# Patient Record
Sex: Female | Born: 1940 | Race: White | Hispanic: No | Marital: Married | Smoking: Never smoker
Health system: Southern US, Community
[De-identification: ages and names within clinical notes are randomized; demographics above are authoritative.]

## PROBLEM LIST (undated history)

## (undated) DIAGNOSIS — R0602 Shortness of breath: Secondary | ICD-10-CM

## (undated) DIAGNOSIS — M1990 Unspecified osteoarthritis, unspecified site: Secondary | ICD-10-CM

## (undated) DIAGNOSIS — I10 Essential (primary) hypertension: Secondary | ICD-10-CM

## (undated) DIAGNOSIS — J189 Pneumonia, unspecified organism: Secondary | ICD-10-CM

## (undated) DIAGNOSIS — D649 Anemia, unspecified: Secondary | ICD-10-CM

## (undated) HISTORY — PX: OTHER SURGICAL HISTORY: SHX169

## (undated) HISTORY — PX: BREAST CYST EXCISION: SHX579

## (undated) HISTORY — PX: HEMORRHOID SURGERY: SHX153

## (undated) HISTORY — PX: TONSILLECTOMY: SUR1361

## (undated) HISTORY — PX: ABDOMINAL HYSTERECTOMY: SHX81

## (undated) HISTORY — PX: APPENDECTOMY: SHX54

## (undated) HISTORY — PX: BREAST SURGERY: SHX581

## (undated) HISTORY — PX: DILATION AND CURETTAGE OF UTERUS: SHX78

---

## 1999-03-23 ENCOUNTER — Encounter: Admission: RE | Admit: 1999-03-23 | Discharge: 1999-03-23 | Payer: Self-pay | Admitting: Obstetrics and Gynecology

## 1999-03-23 ENCOUNTER — Encounter: Payer: Self-pay | Admitting: Obstetrics and Gynecology

## 1999-04-04 ENCOUNTER — Encounter: Admission: RE | Admit: 1999-04-04 | Discharge: 1999-04-04 | Payer: Self-pay | Admitting: Obstetrics and Gynecology

## 1999-04-04 ENCOUNTER — Encounter: Payer: Self-pay | Admitting: Obstetrics and Gynecology

## 1999-06-07 ENCOUNTER — Encounter: Payer: Self-pay | Admitting: General Surgery

## 1999-06-09 ENCOUNTER — Encounter (INDEPENDENT_AMBULATORY_CARE_PROVIDER_SITE_OTHER): Payer: Self-pay

## 1999-06-09 ENCOUNTER — Ambulatory Visit (HOSPITAL_COMMUNITY): Admission: RE | Admit: 1999-06-09 | Discharge: 1999-06-09 | Payer: Self-pay | Admitting: General Surgery

## 2000-03-27 ENCOUNTER — Encounter: Admission: RE | Admit: 2000-03-27 | Discharge: 2000-03-27 | Payer: Self-pay | Admitting: Obstetrics and Gynecology

## 2000-03-27 ENCOUNTER — Encounter: Payer: Self-pay | Admitting: Obstetrics and Gynecology

## 2001-03-28 ENCOUNTER — Encounter: Admission: RE | Admit: 2001-03-28 | Discharge: 2001-03-28 | Payer: Self-pay | Admitting: Obstetrics and Gynecology

## 2001-03-28 ENCOUNTER — Encounter: Payer: Self-pay | Admitting: Obstetrics and Gynecology

## 2002-04-01 ENCOUNTER — Encounter: Admission: RE | Admit: 2002-04-01 | Discharge: 2002-04-01 | Payer: Self-pay | Admitting: Obstetrics and Gynecology

## 2002-04-01 ENCOUNTER — Encounter: Payer: Self-pay | Admitting: Obstetrics and Gynecology

## 2002-04-09 ENCOUNTER — Encounter (INDEPENDENT_AMBULATORY_CARE_PROVIDER_SITE_OTHER): Payer: Self-pay | Admitting: *Deleted

## 2002-04-09 ENCOUNTER — Ambulatory Visit (HOSPITAL_COMMUNITY): Admission: RE | Admit: 2002-04-09 | Discharge: 2002-04-09 | Payer: Self-pay | Admitting: Gastroenterology

## 2003-04-15 ENCOUNTER — Encounter: Admission: RE | Admit: 2003-04-15 | Discharge: 2003-04-15 | Payer: Self-pay | Admitting: Obstetrics and Gynecology

## 2004-03-29 ENCOUNTER — Encounter: Admission: RE | Admit: 2004-03-29 | Discharge: 2004-03-29 | Payer: Self-pay | Admitting: Obstetrics and Gynecology

## 2004-04-05 ENCOUNTER — Encounter: Admission: RE | Admit: 2004-04-05 | Discharge: 2004-04-05 | Payer: Self-pay | Admitting: Obstetrics and Gynecology

## 2004-05-03 ENCOUNTER — Encounter: Admission: RE | Admit: 2004-05-03 | Discharge: 2004-05-03 | Payer: Self-pay | Admitting: Orthopedic Surgery

## 2005-04-06 ENCOUNTER — Ambulatory Visit (HOSPITAL_COMMUNITY): Admission: RE | Admit: 2005-04-06 | Discharge: 2005-04-06 | Payer: Self-pay | Admitting: Obstetrics and Gynecology

## 2006-04-09 ENCOUNTER — Encounter: Admission: RE | Admit: 2006-04-09 | Discharge: 2006-04-09 | Payer: Self-pay | Admitting: Obstetrics and Gynecology

## 2007-04-11 ENCOUNTER — Encounter: Admission: RE | Admit: 2007-04-11 | Discharge: 2007-04-11 | Payer: Self-pay | Admitting: Obstetrics and Gynecology

## 2008-04-13 ENCOUNTER — Encounter: Admission: RE | Admit: 2008-04-13 | Discharge: 2008-04-13 | Payer: Self-pay | Admitting: Obstetrics and Gynecology

## 2009-04-14 ENCOUNTER — Encounter: Admission: RE | Admit: 2009-04-14 | Discharge: 2009-04-14 | Payer: Self-pay | Admitting: Internal Medicine

## 2009-06-17 ENCOUNTER — Encounter: Admission: RE | Admit: 2009-06-17 | Discharge: 2009-06-17 | Payer: Self-pay | Admitting: Specialist

## 2009-06-21 ENCOUNTER — Ambulatory Visit (HOSPITAL_BASED_OUTPATIENT_CLINIC_OR_DEPARTMENT_OTHER): Admission: RE | Admit: 2009-06-21 | Discharge: 2009-06-21 | Payer: Self-pay | Admitting: Specialist

## 2010-04-15 ENCOUNTER — Encounter: Admission: RE | Admit: 2010-04-15 | Discharge: 2010-04-15 | Payer: Self-pay | Admitting: Obstetrics and Gynecology

## 2010-06-18 ENCOUNTER — Encounter: Payer: Self-pay | Admitting: Obstetrics and Gynecology

## 2010-08-15 LAB — BASIC METABOLIC PANEL
BUN: 12 mg/dL (ref 6–23)
CO2: 29 mEq/L (ref 19–32)
Calcium: 9.6 mg/dL (ref 8.4–10.5)
Chloride: 103 mEq/L (ref 96–112)
Creatinine, Ser: 0.65 mg/dL (ref 0.4–1.2)
GFR calc Af Amer: >60 mL/min (ref >60)
GFR calc non Af Amer: >60 mL/min (ref >60)
Glucose, Bld: 105 mg/dL — ABNORMAL HIGH (ref 70–99)
Potassium: 3.7 mEq/L (ref 3.5–5.1)
Sodium: 139 mEq/L (ref 135–145)

## 2010-08-15 LAB — POCT HEMOGLOBIN-HEMACUE: Hemoglobin: 14.4 g/dL (ref 12.0–15.0)

## 2010-10-14 NOTE — Op Note (Signed)
[place]  Patient:    Sara Boyer, Sara Boyer                      MRN: [id number] Proc. Date: 06/09/99 Attending:  Sheppard Plumber. Earlene Plater, M.D.                           Operative Report  PREOPERATIVE DIAGNOSIS:  Internal and external hemorrhoids.  POSTOPERATIVE DIAGNOSIS:  Internal and external hemorrhoids.  PROCEDURE:  Hemorrhoidectomy, complex and proctosigmoidoscopy.  SURGEON:  Timothy E. Earlene Plater, M.D.  ANESTHESIA:  General.  INDICATIONS:  [name] is an otherwise healthy 70 year old Caucasian female ho is treated for stable hypertension.  She has had hemorrhoids for 20+ years, but in the past two years, they have gotten worse.  She now has constant protrusion, constant ______, and daily bleeding.  The pain has increased over the past few weeks.  The patient wears pads, maintains a diet, does not strain at stool, and  still has this problem.  She has failed all conservative management.  Surgical hemorrhoidectomy has been discussed with the patient, and she is prepared for that surgery.  DESCRIPTION OF PROCEDURE:  The patient was brought to the operating room, placed supine, and general pretracheal mask anesthesia was provided.  She was placed in the lithotomy position.  The perianal area was cleansed, prepped, and draped in the usual fashion.  A proctosigmoidoscopy was accomplished to 22 cm, and it was normal. The scope was withdrawn.  The area was reprepped.  The hemorrhoids were massive in the right anterior and right posterior position and only fourth degree in the left lateral.  All areas were injected around and about with 0.5% Marcaine with epinephrine mixed 9:1 with Wydase and massaged in well.  Hemorrhoidectomies were accomplished in the right anterior, right posterior, and left lateral positions  successively by a suture ligature placed at the apex of the hemorrhoid. Careful dissection of the hemorrhoid, and then closure of  the mucosa, anoderm, and skin  with a running 2-0 chromic.  There was no bleeding and no complications.  The sphincters were intact, and there was no other pathology.  With this, the procedure was complete.  Gelfoam, gauze, and a dry, sterile dressing were applied.  She tolerated it well, was awakened, and taken to the recovery room in good condition. Written and verbal instructions were discussed with her prior to anesthesia and  with her husband postoperatively.  She will be seen and followed as an outpatient.  DD:  06/09/99 TD:  06/09/99 Job: [id number]

## 2010-10-14 NOTE — Op Note (Signed)
   NAME:  Sara Boyer, CALLIES NO.:  [id number]   MEDICAL RECORD NO.:  [id number]                    PATIENT TYPE:   LOCATION:                                       FACILITY:   PHYSICIAN:  Danise Edge, M.D.                DATE OF BIRTH:   DATE OF PROCEDURE:  04/09/2002  DATE OF DISCHARGE:                                 OPERATIVE REPORT   PROCEDURE:  Screening colonoscopy with cecal polypectomy.   INDICATIONS:  The patient is a 70 year old female born August 01, 2040.  The  patient is scheduled to undergo her first screening colonoscopy with  polypectomy to prevent colon cancer.  I discussed with her the complications  associated with colonoscopy and polypectomy, including intestinal bleeding  and intestinal perforation.  The patient has signed the operative permit.   ENDOSCOPIST:  Danise Edge, M.D.   PREMEDICATION:  Versed 7 mg, Demerol 50 mg.   ENDOSCOPE:  Olympus pediatric colonoscope.   DESCRIPTION OF PROCEDURE:  After obtaining informed consent, the patient was  placed in the left lateral decubitus position.  I administered intravenous  Demerol and intravenous Versed to achieve conscious sedation for the  procedure.  The patient's blood pressure, oxygen saturation, and cardiac  rhythm were monitored throughout the procedure and documented in the medical  record.   Anal inspection was normal.  Digital rectal exam was normal.  The Olympus  pediatric video colonoscope was introduced into the rectum and advanced to  the cecum.  Colonic preparation for the exam today was excellent.   Rectum normal.   Sigmoid colon and descending colon normal.   Splenic flexure normal.   Transverse colon normal.   Hepatic flexure normal.   Ascending colon normal.   Cecum and ileocecal valve:  From the proximal cecum a 2 mm sessile polyp was  lifted by submucosal saline injection and removed in piecemeal fashion with  the electrocautery snare.    ASSESSMENT:   A 2 mm sessile polyp was removed from the cecum; otherwise  normal proctocolonoscopy to the cecum.                                               Danise Edge, M.D.    MJ/MEDQ  D:  04/09/2002  T:  04/09/2002  Job:  [id number]

## 2011-04-11 ENCOUNTER — Other Ambulatory Visit: Payer: Self-pay | Admitting: Internal Medicine

## 2011-04-11 ENCOUNTER — Other Ambulatory Visit: Payer: Self-pay | Admitting: Family Medicine

## 2011-04-11 DIAGNOSIS — Z1231 Encounter for screening mammogram for malignant neoplasm of breast: Secondary | ICD-10-CM

## 2011-04-17 ENCOUNTER — Ambulatory Visit
Admission: RE | Admit: 2011-04-17 | Discharge: 2011-04-17 | Disposition: A | Discharge status: Home / Self Care | Payer: Medicare Other | Source: Ambulatory Visit | Attending: Internal Medicine | Admitting: Internal Medicine

## 2011-04-17 DIAGNOSIS — Z1231 Encounter for screening mammogram for malignant neoplasm of breast: Secondary | ICD-10-CM

## 2011-04-24 ENCOUNTER — Other Ambulatory Visit: Payer: Self-pay | Admitting: Oral Surgery

## 2011-04-25 ENCOUNTER — Other Ambulatory Visit: Payer: Self-pay | Admitting: Internal Medicine

## 2011-04-25 DIAGNOSIS — R928 Other abnormal and inconclusive findings on diagnostic imaging of breast: Secondary | ICD-10-CM

## 2011-05-09 ENCOUNTER — Ambulatory Visit
Admission: RE | Admit: 2011-05-09 | Discharge: 2011-05-09 | Disposition: A | Discharge status: Home / Self Care | Payer: Medicare Other | Source: Ambulatory Visit | Attending: Internal Medicine | Admitting: Internal Medicine

## 2011-05-09 DIAGNOSIS — R928 Other abnormal and inconclusive findings on diagnostic imaging of breast: Secondary | ICD-10-CM

## 2012-01-18 ENCOUNTER — Encounter (HOSPITAL_COMMUNITY): Payer: Self-pay | Admitting: Pharmacy Technician

## 2012-01-18 NOTE — Patient Instructions (Signed)
[name]  01/18/2012   Your procedure is scheduled on:  01/24/12 1255pm-225pm  Report to [facility] at 1030 AM.  Call this number if you have problems the morning of surgery: [phone]   Remember:   Do not eat food:After Midnight.  May have clear liquids:until 0700am then npo .    Take these medicines the morning of surgery with A SIP OF WATER:   Do not wear jewelry, make-up or nail polish.  Do not wear lotions, powders, or perfumes.   Do not shave 48 hours prior to surgery.   Do not bring valuables to the hospital.  Contacts, dentures or bridgework may not be worn into surgery.  Leave suitcase in the car. After surgery it may be brought to your room.  For patients admitted to the hospital, checkout time is 11:00 AM the day of discharge.      Special Instructions: CHG Shower Use Special Wash: 1/2 bottle night before surgery and 1/2 bottle morning of surgery. shower chin to toes with CHG.  Wash face and private parts with regular soap.    Please read over the following fact sheets that you were given: MRSA Information, coughing and deep breathing exercises, leg exercises

## 2012-01-19 ENCOUNTER — Encounter (HOSPITAL_COMMUNITY): Payer: Self-pay

## 2012-01-19 ENCOUNTER — Encounter (HOSPITAL_COMMUNITY)
Admission: RE | Admit: 2012-01-19 | Discharge: 2012-01-19 | Disposition: A | Discharge status: Home / Self Care | Payer: Medicare Other | Source: Ambulatory Visit | Attending: Orthopedic Surgery | Admitting: Orthopedic Surgery

## 2012-01-19 ENCOUNTER — Ambulatory Visit (HOSPITAL_COMMUNITY): Admission: RE | Admit: 2012-01-19 | Payer: Medicare Other | Source: Ambulatory Visit

## 2012-01-19 HISTORY — DX: Anemia, unspecified: D64.9

## 2012-01-19 HISTORY — DX: Shortness of breath: R06.02

## 2012-01-19 HISTORY — DX: Essential (primary) hypertension: I10

## 2012-01-19 HISTORY — DX: Pneumonia, unspecified organism: J18.9

## 2012-01-19 LAB — CBC
HCT: 41.2 % (ref 36.0–46.0)
MCHC: 34.7 g/dL (ref 30.0–36.0)
MCV: 87.1 fL (ref 78.0–100.0)
RDW: 13.3 % (ref 11.5–15.5)

## 2012-01-19 LAB — COMPREHENSIVE METABOLIC PANEL
ALT: 18 U/L (ref 0–35)
AST: 17 U/L (ref 0–37)
Albumin: 3.9 g/dL (ref 3.5–5.2)
Alkaline Phosphatase: 53 U/L (ref 39–117)
BUN: 13 mg/dL (ref 6–23)
CO2: 29 mEq/L (ref 19–32)
Calcium: 9.6 mg/dL (ref 8.4–10.5)
Chloride: 101 mEq/L (ref 96–112)
Creatinine, Ser: 0.7 mg/dL (ref 0.50–1.10)
GFR calc Af Amer: >90 mL/min (ref >90)
GFR calc non Af Amer: 85 mL/min — ABNORMAL LOW (ref >90)
Glucose, Bld: 98 mg/dL (ref 70–99)
Potassium: 3.3 mEq/L — ABNORMAL LOW (ref 3.5–5.1)
Sodium: 139 mEq/L (ref 135–145)
Total Bilirubin: 0.8 mg/dL (ref 0.3–1.2)
Total Protein: 6.9 g/dL (ref 6.0–8.3)

## 2012-01-19 LAB — URINALYSIS, ROUTINE W REFLEX MICROSCOPIC
Bilirubin Urine: NEGATIVE
Glucose, UA: NEGATIVE mg/dL
Hgb urine dipstick: NEGATIVE
Ketones, ur: NEGATIVE mg/dL
Leukocytes, UA: NEGATIVE
Nitrite: NEGATIVE
Protein, ur: NEGATIVE mg/dL
Specific Gravity, Urine: 1.02 (ref 1.005–1.030)
Urobilinogen, UA: 0.2 mg/dL (ref 0.0–1.0)
pH: 6 (ref 5.0–8.0)

## 2012-01-19 LAB — PROTIME-INR
INR: 0.89 (ref 0.00–1.49)
Prothrombin Time: 12.2 seconds (ref 11.6–15.2)

## 2012-01-19 LAB — APTT: aPTT: 33 seconds (ref 24–37)

## 2012-01-19 NOTE — H&P (Signed)
  Sara Boyer DOB: 1940/11/04  Chief Complaint: right shoulder pain  History of Present Illness The patient is a 71 year old female who is scheduled for a right shoulder open rotator cuff repair with Dr. Darrelyn Hillock Wednesday January 24, 2012.She presented reporting pain, pain with reaching and pain with lifting at the left shoulder that began 10 weeks ago. The injury resulted from jerking motion that occurred at home. She had a traction injury to her left shoulder. She was holding onto a steering wheel on a riding mower and she slipped and fell. The patient reports symptoms which include shoulder pain, night pain and inability to lay on that side.  Symptoms are mildly relieved by application of ice and application of heat, while symptoms are not relieved by nonsteroidal anti-inflammatory drugs. MRI revealed torn right rotator cuff tendon.    Past Medical History Allergic Urticaria Hypertension   Allergies Tetanus Toxoid Adsorbed *TOXOIDS*   Family History Family history unknown - Adopted   Social History Alcohol use. current drinker; drinks wine; only occasionally per week Children. 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 married Pain Contract. no Tobacco use. never smoker   Medication History AmLODIPine Besylate (5MG  Tablet, Oral) Active. Valsartan-Hydrochlorothiazide (160-12.5MG  Tablet, Oral) Active. Klor-Con M20 ( Tablet ER, Oral) Active. Nitrostat (0.4MG  Tab Sublingual, Sublingual) Active.    Past Surgical History Appendectomy Breast Biopsy. right Cataract Surgery. bilateral Foot Surgery. bilateral Hemorrhoidectomy Hysterectomy. partial (non-cancerous) Thyroidectomy; Subtotal. right Tonsillectomy    Review of Systems General:Present- Weight Gain. Not Present- Chills, Fever, Night Sweats, Appetite Loss, Fatigue, Feeling sick and Weight Loss. Skin:Not Present- Itching, Rash,  Skin Color Changes, Ulcer, Psoriasis and Change in Hair or Nails. HEENT:Present- Sensitivity to light. Not Present- Hearing problems, Nose Bleed and Ringing in the Ears. Neck:Not Present- Swollen Glands and Neck Mass. Respiratory:Not Present- Snoring, Chronic Cough, Bloody sputum and Dyspnea. Cardiovascular:Present- Swelling of Extremities. Not Present- Shortness of Breath, Chest Pain, Leg Cramps and Palpitations. Gastrointestinal:Present- Incontinence of Stool. Not Present- Bloody Stool, Heartburn, Abdominal Pain, Vomiting and Nausea. Female Genitourinary:Not Present- Blood in Urine, Menstrual Irregularities, Frequency, Incontinence and Nocturia. Musculoskeletal:Not Present- Muscle Weakness, Muscle Pain, Joint Stiffness, Joint Swelling, Joint Pain and Back Pain. Neurological:Not Present- Tingling, Numbness, Burning, Tremor, Headaches and Dizziness. Psychiatric:Not Present- Anxiety, Depression and Memory Loss. Endocrine:Not Present- Cold Intolerance, Heat Intolerance, Excessive hunger and Excessive Thirst. Hematology:Not Present- Abnormal Bleeding, Anemia, Blood Clots and Easy Bruising.   Vitals Weight: 148 lb Height: 61 in Body Surface Area: 1.7 m Body Mass Index: 27.96 kg/m Pulse: 73 (Regular) BP: 139/62 (Sitting, Left Arm, Standard)    Physical Exam She has just about complete loss of all functions about that shoulder. She has a rather significant amount of pain. She has trouble with abduction, internal and external rotation. She cannot get her hand to her bra or behind her head either. She has a good radial pulse. Normal neurological function in her extremity. Biceps intact. She has good flexion/extension of her elbow. No other major injuries. On auscultation the lungs are clear. The heart is normal sinus rhythm with no murmur. Neck is supple. Oral cavity is negative. EOM intact. Abdomen soft and nontender.     RADIOGRAPHS: X-rays of the right  shoulder shows marked down sloping of the acromion.    Assessment & Plan Complete rotator cuff rupture, non-traumatic (727.61) Right shoulder open rotator cuff repair with possible graft and anchors     Dimitri Ped, PA-C

## 2012-01-24 ENCOUNTER — Encounter (HOSPITAL_COMMUNITY): Payer: Self-pay | Admitting: Orthopedic Surgery

## 2012-01-24 ENCOUNTER — Encounter (HOSPITAL_COMMUNITY)
Admission: RE | Disposition: A | Discharge status: Home / Self Care | Payer: Self-pay | Source: Ambulatory Visit | Attending: Orthopedic Surgery

## 2012-01-24 ENCOUNTER — Encounter (HOSPITAL_COMMUNITY): Payer: Self-pay | Admitting: Anesthesiology

## 2012-01-24 ENCOUNTER — Ambulatory Visit (HOSPITAL_COMMUNITY)
Admission: RE | Admit: 2012-01-24 | Discharge: 2012-01-25 | Disposition: A | Discharge status: Home / Self Care | Payer: Medicare Other | Source: Ambulatory Visit | Attending: Orthopedic Surgery | Admitting: Orthopedic Surgery

## 2012-01-24 ENCOUNTER — Encounter (HOSPITAL_COMMUNITY): Payer: Self-pay | Admitting: *Deleted

## 2012-01-24 ENCOUNTER — Ambulatory Visit (HOSPITAL_COMMUNITY): Payer: Medicare Other | Admitting: Anesthesiology

## 2012-01-24 DIAGNOSIS — M75122 Complete rotator cuff tear or rupture of left shoulder, not specified as traumatic: Secondary | ICD-10-CM | POA: Diagnosis present

## 2012-01-24 DIAGNOSIS — Z79899 Other long term (current) drug therapy: Secondary | ICD-10-CM | POA: Insufficient documentation

## 2012-01-24 DIAGNOSIS — I10 Essential (primary) hypertension: Secondary | ICD-10-CM | POA: Insufficient documentation

## 2012-01-24 DIAGNOSIS — M75120 Complete rotator cuff tear or rupture of unspecified shoulder, not specified as traumatic: Secondary | ICD-10-CM | POA: Insufficient documentation

## 2012-01-24 DIAGNOSIS — Z01812 Encounter for preprocedural laboratory examination: Secondary | ICD-10-CM | POA: Insufficient documentation

## 2012-01-24 DIAGNOSIS — M25819 Other specified joint disorders, unspecified shoulder: Secondary | ICD-10-CM | POA: Insufficient documentation

## 2012-01-24 HISTORY — PX: SHOULDER OPEN ROTATOR CUFF REPAIR: SHX2407

## 2012-01-24 SURGERY — REPAIR, ROTATOR CUFF, OPEN
Anesthesia: General | Site: Shoulder | Laterality: Left | Wound class: Clean

## 2012-01-24 MED ORDER — DEXAMETHASONE SODIUM PHOSPHATE 4 MG/ML IJ SOLN
INTRAMUSCULAR | Status: DC | PRN
  Administered 2012-01-24: 10 mg via INTRAVENOUS

## 2012-01-24 MED ORDER — LACTATED RINGERS IV SOLN: INTRAVENOUS | Status: DC

## 2012-01-24 MED ORDER — ACETAMINOPHEN 10 MG/ML IV SOLN
INTRAVENOUS | Status: AC
  Filled 2012-01-24: qty 100

## 2012-01-24 MED ORDER — BACITRACIN-NEOMYCIN-POLYMYXIN 400-5-5000 EX OINT
TOPICAL_OINTMENT | CUTANEOUS | Status: DC | PRN
  Administered 2012-01-24: 1 via TOPICAL

## 2012-01-24 MED ORDER — ACETAMINOPHEN 650 MG RE SUPP: 650.0000 mg | Freq: Four times a day (QID) | RECTAL | Status: DC | PRN

## 2012-01-24 MED ORDER — LACTATED RINGERS IV SOLN
INTRAVENOUS | Status: DC
  Administered 2012-01-24 – 2012-01-25 (×2): via INTRAVENOUS

## 2012-01-24 MED ORDER — BISACODYL 10 MG RE SUPP: 10.0000 mg | Freq: Every day | RECTAL | Status: DC | PRN

## 2012-01-24 MED ORDER — SODIUM CHLORIDE 0.9 % IR SOLN
Status: DC | PRN
  Administered 2012-01-24: 13:00:00

## 2012-01-24 MED ORDER — HYDROMORPHONE HCL PF 1 MG/ML IJ SOLN
INTRAMUSCULAR | Status: AC
  Filled 2012-01-24: qty 1

## 2012-01-24 MED ORDER — ONDANSETRON HCL 4 MG PO TABS: 4.0000 mg | ORAL_TABLET | Freq: Four times a day (QID) | ORAL | Status: DC | PRN

## 2012-01-24 MED ORDER — METHOCARBAMOL 100 MG/ML IJ SOLN
500.0000 mg | Freq: Four times a day (QID) | INTRAVENOUS | Status: DC | PRN
  Administered 2012-01-24 – 2012-01-25 (×2): 500 mg via INTRAVENOUS
  Filled 2012-01-24 (×2): qty 5

## 2012-01-24 MED ORDER — METOCLOPRAMIDE HCL 5 MG/ML IJ SOLN
INTRAMUSCULAR | Status: DC | PRN
  Administered 2012-01-24: 10 mg via INTRAVENOUS

## 2012-01-24 MED ORDER — HYDROCODONE-ACETAMINOPHEN 5-325 MG PO TABS
1.0000 | ORAL_TABLET | ORAL | Status: DC | PRN
  Administered 2012-01-24: 2 via ORAL
  Filled 2012-01-24: qty 2

## 2012-01-24 MED ORDER — ACETAMINOPHEN 10 MG/ML IV SOLN
INTRAVENOUS | Status: DC | PRN
  Administered 2012-01-24: 1000 mg via INTRAVENOUS

## 2012-01-24 MED ORDER — HYDROMORPHONE HCL PF 1 MG/ML IJ SOLN
0.5000 mg | INTRAMUSCULAR | Status: DC | PRN
  Administered 2012-01-24: 1 mg via INTRAVENOUS
  Filled 2012-01-24: qty 1

## 2012-01-24 MED ORDER — HYDROCHLOROTHIAZIDE 12.5 MG PO CAPS
12.5000 mg | ORAL_CAPSULE | Freq: Every day | ORAL | Status: DC
  Filled 2012-01-24: qty 1

## 2012-01-24 MED ORDER — PHENOL 1.4 % MT LIQD: 1.0000 | OROMUCOSAL | Status: DC | PRN

## 2012-01-24 MED ORDER — AMLODIPINE BESYLATE 5 MG PO TABS
5.0000 mg | ORAL_TABLET | Freq: Every morning | ORAL | Status: DC
  Filled 2012-01-24: qty 1

## 2012-01-24 MED ORDER — HYDROMORPHONE HCL PF 1 MG/ML IJ SOLN
INTRAMUSCULAR | Status: DC | PRN
  Administered 2012-01-24: 0.5 mg via INTRAVENOUS
  Administered 2012-01-24: 1 mg via INTRAVENOUS
  Administered 2012-01-24: 0.5 mg via INTRAVENOUS

## 2012-01-24 MED ORDER — VALSARTAN-HYDROCHLOROTHIAZIDE 160-12.5 MG PO TABS: 1.0000 | ORAL_TABLET | Freq: Every morning | ORAL | Status: DC

## 2012-01-24 MED ORDER — CEFAZOLIN SODIUM-DEXTROSE 2-3 GM-% IV SOLR
2.0000 g | INTRAVENOUS | Status: AC
  Administered 2012-01-24: 2 g via INTRAVENOUS

## 2012-01-24 MED ORDER — CEFAZOLIN SODIUM 1-5 GM-% IV SOLN
1.0000 g | Freq: Four times a day (QID) | INTRAVENOUS | Status: AC
  Administered 2012-01-24 – 2012-01-25 (×3): 1 g via INTRAVENOUS
  Filled 2012-01-24 (×3): qty 50

## 2012-01-24 MED ORDER — ONDANSETRON HCL 4 MG/2ML IJ SOLN
4.0000 mg | Freq: Four times a day (QID) | INTRAMUSCULAR | Status: DC | PRN
  Administered 2012-01-25: 4 mg via INTRAVENOUS
  Filled 2012-01-24: qty 2

## 2012-01-24 MED ORDER — FLEET ENEMA 7-19 GM/118ML RE ENEM: 1.0000 | ENEMA | Freq: Once | RECTAL | Status: AC | PRN

## 2012-01-24 MED ORDER — FENTANYL CITRATE 0.05 MG/ML IJ SOLN
50.0000 ug | INTRAMUSCULAR | Status: DC | PRN
  Administered 2012-01-24 (×2): 25 ug via INTRAVENOUS

## 2012-01-24 MED ORDER — METHOCARBAMOL 500 MG PO TABS
500.0000 mg | ORAL_TABLET | Freq: Four times a day (QID) | ORAL | Status: DC | PRN
  Administered 2012-01-25: 500 mg via ORAL
  Filled 2012-01-24: qty 1

## 2012-01-24 MED ORDER — ACETAMINOPHEN 325 MG PO TABS: 650.0000 mg | ORAL_TABLET | Freq: Four times a day (QID) | ORAL | Status: DC | PRN

## 2012-01-24 MED ORDER — KETAMINE HCL 10 MG/ML IJ SOLN
INTRAMUSCULAR | Status: DC | PRN
  Administered 2012-01-24: 1 mg via INTRAVENOUS
  Administered 2012-01-24: 2 mg via INTRAVENOUS
  Administered 2012-01-24: 25 mg via INTRAVENOUS
  Administered 2012-01-24 (×2): 1 mg via INTRAVENOUS

## 2012-01-24 MED ORDER — SUCCINYLCHOLINE CHLORIDE 20 MG/ML IJ SOLN
INTRAMUSCULAR | Status: DC | PRN
  Administered 2012-01-24: 100 mg via INTRAVENOUS

## 2012-01-24 MED ORDER — CEFAZOLIN SODIUM-DEXTROSE 2-3 GM-% IV SOLR
INTRAVENOUS | Status: AC
  Filled 2012-01-24: qty 50

## 2012-01-24 MED ORDER — THROMBIN 5000 UNITS EX SOLR
CUTANEOUS | Status: DC | PRN
  Administered 2012-01-24: 5000 [IU] via TOPICAL

## 2012-01-24 MED ORDER — HYDROMORPHONE HCL PF 1 MG/ML IJ SOLN
0.2500 mg | INTRAMUSCULAR | Status: DC | PRN
  Administered 2012-01-24: 0.25 mg via INTRAVENOUS
  Administered 2012-01-24: 0.5 mg via INTRAVENOUS
  Administered 2012-01-24 (×3): 0.25 mg via INTRAVENOUS
  Administered 2012-01-24: 0.5 mg via INTRAVENOUS

## 2012-01-24 MED ORDER — POTASSIUM CHLORIDE CRYS ER 20 MEQ PO TBCR
20.0000 meq | EXTENDED_RELEASE_TABLET | Freq: Every morning | ORAL | Status: DC
  Filled 2012-01-24: qty 1

## 2012-01-24 MED ORDER — IRBESARTAN 150 MG PO TABS
150.0000 mg | ORAL_TABLET | Freq: Every day | ORAL | Status: DC
  Filled 2012-01-24: qty 1

## 2012-01-24 MED ORDER — PROPOFOL 10 MG/ML IV BOLUS
INTRAVENOUS | Status: DC | PRN
  Administered 2012-01-24: 200 mg via INTRAVENOUS

## 2012-01-24 MED ORDER — LACTATED RINGERS IV SOLN
INTRAVENOUS | Status: DC
  Administered 2012-01-24 (×2): 1000 mL via INTRAVENOUS

## 2012-01-24 MED ORDER — BACITRACIN-NEOMYCIN-POLYMYXIN 400-5-5000 EX OINT
TOPICAL_OINTMENT | CUTANEOUS | Status: AC
  Filled 2012-01-24: qty 1

## 2012-01-24 MED ORDER — BUPIVACAINE LIPOSOME 1.3 % IJ SUSP
20.0000 mL | INTRAMUSCULAR | Status: AC
  Administered 2012-01-24: 20 mL
  Filled 2012-01-24: qty 20

## 2012-01-24 MED ORDER — ONDANSETRON HCL 4 MG/2ML IJ SOLN
INTRAMUSCULAR | Status: DC | PRN
  Administered 2012-01-24: 4 mg via INTRAVENOUS

## 2012-01-24 MED ORDER — MENTHOL 3 MG MT LOZG: 1.0000 | LOZENGE | OROMUCOSAL | Status: DC | PRN

## 2012-01-24 MED ORDER — THROMBIN 5000 UNITS EX SOLR
CUTANEOUS | Status: AC
  Filled 2012-01-24: qty 5000

## 2012-01-24 MED ORDER — ASPIRIN EC 81 MG PO TBEC
81.0000 mg | DELAYED_RELEASE_TABLET | Freq: Every day | ORAL | Status: DC
  Administered 2012-01-24: 81 mg via ORAL
  Filled 2012-01-24 (×2): qty 1

## 2012-01-24 MED ORDER — FENTANYL CITRATE 0.05 MG/ML IJ SOLN
INTRAMUSCULAR | Status: AC
  Filled 2012-01-24: qty 2

## 2012-01-24 MED ORDER — FENTANYL CITRATE 0.05 MG/ML IJ SOLN
INTRAMUSCULAR | Status: DC | PRN
  Administered 2012-01-24 (×2): 50 ug via INTRAVENOUS

## 2012-01-24 MED ORDER — LIDOCAINE HCL (CARDIAC) 20 MG/ML IV SOLN
INTRAVENOUS | Status: DC | PRN
  Administered 2012-01-24: 50 mg via INTRAVENOUS

## 2012-01-24 MED ORDER — OXYCODONE-ACETAMINOPHEN 5-325 MG PO TABS
1.0000 | ORAL_TABLET | ORAL | Status: DC | PRN
  Administered 2012-01-25: 2 via ORAL
  Filled 2012-01-24: qty 2

## 2012-01-24 SURGICAL SUPPLY — 50 items
ANCHOR PEEK ZIP 5.5 NDL NO2 (Orthopedic Implant) ×2 IMPLANT
BAG ZIPLOCK 12X15 (MISCELLANEOUS) ×2 IMPLANT
BLADE OSCILLATING/SAGITTAL (BLADE) ×1
BLADE SW THK.38XMED LNG THN (BLADE) ×1 IMPLANT
BNDG COHESIVE 6X5 TAN NS LF (GAUZE/BANDAGES/DRESSINGS) IMPLANT
BUR OVAL CARBIDE 4.0 (BURR) ×2 IMPLANT
CLEANER TIP ELECTROSURG 2X2 (MISCELLANEOUS) ×2 IMPLANT
CLOTH BEACON ORANGE TIMEOUT ST (SAFETY) ×2 IMPLANT
DRAPE POUCH INSTRU U-SHP 10X18 (DRAPES) ×2 IMPLANT
DRSG EMULSION OIL 3X3 NADH (GAUZE/BANDAGES/DRESSINGS) ×2 IMPLANT
DRSG PAD ABDOMINAL 8X10 ST (GAUZE/BANDAGES/DRESSINGS) ×2 IMPLANT
DURAPREP 26ML APPLICATOR (WOUND CARE) ×2 IMPLANT
ELECT REM PT RETURN 9FT ADLT (ELECTROSURGICAL) ×2
ELECTRODE REM PT RTRN 9FT ADLT (ELECTROSURGICAL) ×1 IMPLANT
FLOSEAL 10ML (HEMOSTASIS) IMPLANT
GLOVE BIOGEL PI IND STRL 8 (GLOVE) ×1 IMPLANT
GLOVE BIOGEL PI IND STRL 8.5 (GLOVE) ×1 IMPLANT
GLOVE BIOGEL PI INDICATOR 8 (GLOVE) ×1
GLOVE BIOGEL PI INDICATOR 8.5 (GLOVE) ×1
GLOVE ECLIPSE 8.0 STRL XLNG CF (GLOVE) ×4 IMPLANT
GLOVE SURG SS PI 6.5 STRL IVOR (GLOVE) ×4 IMPLANT
GOWN PREVENTION PLUS LG XLONG (DISPOSABLE) ×4 IMPLANT
GOWN STRL NON-REIN LRG LVL3 (GOWN DISPOSABLE) ×4 IMPLANT
GOWN STRL REIN XL XLG (GOWN DISPOSABLE) ×4 IMPLANT
KIT BASIN OR (CUSTOM PROCEDURE TRAY) ×2 IMPLANT
MANIFOLD NEPTUNE II (INSTRUMENTS) ×2 IMPLANT
NEEDLE MA TROC 1/2 (NEEDLE) IMPLANT
NS IRRIG 1000ML POUR BTL (IV SOLUTION) ×2 IMPLANT
PACK SHOULDER CUSTOM OPM052 (CUSTOM PROCEDURE TRAY) ×2 IMPLANT
PASSER SUT SWANSON 36MM LOOP (INSTRUMENTS) IMPLANT
PATCH TISSUE MEND 3X3CM (Orthopedic Implant) ×2 IMPLANT
POSITIONER SURGICAL ARM (MISCELLANEOUS) ×2 IMPLANT
SLING ARM IMMOBILIZER LRG (SOFTGOODS) ×2 IMPLANT
SLING ARM IMMOBILIZER MED (SOFTGOODS) ×2 IMPLANT
SPONGE GAUZE 4X4 12PLY (GAUZE/BANDAGES/DRESSINGS) ×2 IMPLANT
SPONGE SURGIFOAM ABS GEL 100 (HEMOSTASIS) ×2 IMPLANT
STAPLER VISISTAT 35W (STAPLE) ×2 IMPLANT
STRIP CLOSURE SKIN 1/2X4 (GAUZE/BANDAGES/DRESSINGS) ×2 IMPLANT
SUCTION FRAZIER 12FR DISP (SUCTIONS) ×2 IMPLANT
SUT BONE WAX W31G (SUTURE) ×2 IMPLANT
SUT ETHIBOND NAB CT1 #1 30IN (SUTURE) IMPLANT
SUT MNCRL AB 4-0 PS2 18 (SUTURE) ×2 IMPLANT
SUT VIC AB 0 CT1 27 (SUTURE) ×1
SUT VIC AB 0 CT1 27XBRD ANTBC (SUTURE) ×1 IMPLANT
SUT VIC AB 1 CT1 27 (SUTURE) ×2
SUT VIC AB 1 CT1 27XBRD ANTBC (SUTURE) ×2 IMPLANT
SUT VIC AB 2-0 CT1 27 (SUTURE)
SUT VIC AB 2-0 CT1 27XBRD (SUTURE) IMPLANT
TAPE CLOTH SURG 4X10 WHT LF (GAUZE/BANDAGES/DRESSINGS) ×8 IMPLANT
TOWEL OR 17X26 10 PK STRL BLUE (TOWEL DISPOSABLE) ×4 IMPLANT

## 2012-01-24 NOTE — Interval H&P Note (Signed)
History and Physical Interval Note:  01/24/2012 11:40 AM  Sara Boyer  has presented today for surgery, with the diagnosis of left shoulder rotator cuff tear  The various methods of treatment have been discussed with the patient and family. After consideration of risks, benefits and other options for treatment, the patient has consented to  Procedure(s) (LRB): ROTATOR CUFF REPAIR SHOULDER OPEN (Left) as a surgical intervention .  The patient's history has been reviewed, patient examined, no change in status, stable for surgery.  I have reviewed the patient's chart and labs.  Questions were answered to the patient's satisfaction.     Sara Boyer A

## 2012-01-24 NOTE — Addendum Note (Signed)
Addendum  created 01/24/12 1437 by Gaetano Hawthorne, MD   Modules edited:Orders

## 2012-01-24 NOTE — Anesthesia Preprocedure Evaluation (Addendum)
Anesthesia Evaluation  Patient identified by MRN, date of birth, ID band Patient awake    Reviewed: Allergy & Precautions, H&P , NPO status , Patient's Chart, lab work & pertinent test results  Airway Mallampati: II TM Distance: >3 FB Neck ROM: full    Dental  (+) Caps and Dental Advisory Given,    Pulmonary shortness of breath and with exertion,  breath sounds clear to auscultation  Pulmonary exam normal       Cardiovascular Exercise Tolerance: Good hypertension, Pt. on medications Rhythm:regular Rate:Normal     Neuro/Psych negative neurological ROS  negative psych ROS   GI/Hepatic negative GI ROS, Neg liver ROS,   Endo/Other  negative endocrine ROS  Renal/GU negative Renal ROS  negative genitourinary   Musculoskeletal   Abdominal   Peds  Hematology negative hematology ROS (+)   Anesthesia Other Findings   Reproductive/Obstetrics negative OB ROS                          Anesthesia Physical Anesthesia Plan  ASA: II  Anesthesia Plan: General   Post-op Pain Management:    Induction: Intravenous  Airway Management Planned: Oral ETT  Additional Equipment:   Intra-op Plan:   Post-operative Plan: Extubation in OR  Informed Consent: I have reviewed the patients History and Physical, chart, labs and discussed the procedure including the risks, benefits and alternatives for the proposed anesthesia with the patient or authorized representative who has indicated his/her understanding and acceptance.   Dental Advisory Given  Plan Discussed with: CRNA and Surgeon  Anesthesia Plan Comments:         Anesthesia Quick Evaluation

## 2012-01-24 NOTE — Brief Op Note (Signed)
01/24/2012  1:21 PM  PATIENT:  Sara Boyer  71 y.o. female  PRE-OPERATIVE DIAGNOSIS:  left shoulder rotator cuff tear,Complex with severe Impingement .  POST-OPERATIVE DIAGNOSIS:  Left shoulder rotator cuff tear,Complex with severe Impingement.  PROCEDURE:  Procedure(s) (LRB): ROTATOR CUFF REPAIR SHOULDER OPEN (Left) with Tissue Mend Graft and one anchor.  SURGEON:  Surgeon(s) and Role:    * Jacki Cones, MD - Primary  :   ASSISTANTS: OR Tech ANESTHESIA:   general  EBL:     BLOOD ADMINISTERED:none  DRAINS: none   LOCAL MEDICATIONS USED:  BUPIVICAINE 20cc   SPECIMEN:  No Specimen  DISPOSITION OF SPECIMEN:  N/A  COUNTS:  YES  TOURNIQUET:  * No tourniquets in log *  DICTATION: .Other Dictation: Dictation Number [id number]  PLAN OF CARE: Admit for overnight observation  PATIENT DISPOSITION:  PACU - hemodynamically stable.   Delay start of Pharmacological VTE agent (>24hrs) due to surgical blood loss or risk of bleeding: yes

## 2012-01-24 NOTE — Anesthesia Procedure Notes (Signed)
Procedure Name: Intubation Date/Time: 01/24/2012 12:11 PM Performed by: Doran Clay Pre-anesthesia Checklist: Patient identified, Timeout performed, Emergency Drugs available, Suction available and Patient being monitored Patient Re-evaluated:Patient Re-evaluated prior to inductionOxygen Delivery Method: Circle system utilized Preoxygenation: Pre-oxygenation with 100% oxygen Intubation Type: IV induction Laryngoscope Size: Mac and 4 Grade View: Grade II Tube type: Oral Tube size: 7.0 mm Number of attempts: 1 Placement Confirmation: ETT inserted through vocal cords under direct vision,  breath sounds checked- equal and bilateral and positive ETCO2 Secured at: 22 cm Tube secured with: Tape Dental Injury: Teeth and Oropharynx as per pre-operative assessment

## 2012-01-24 NOTE — Anesthesia Postprocedure Evaluation (Signed)
  Anesthesia Post-op Note  Patient: Sara Boyer  Procedure(s) Performed: Procedure(s) (LRB): ROTATOR CUFF REPAIR SHOULDER OPEN (Left)  Patient Location: PACU  Anesthesia Type: General  Level of Consciousness: awake and alert   Airway and Oxygen Therapy: Patient Spontanous Breathing  Post-op Pain: mild  Post-op Assessment: Post-op Vital signs reviewed, Patient's Cardiovascular Status Stable, Respiratory Function Stable, Patent Airway and No signs of Nausea or vomiting  Post-op Vital Signs: stable  Complications: No apparent anesthesia complications

## 2012-01-24 NOTE — Preoperative (Signed)
Beta Blockers   Reason not to administer Beta Blockers:Not Applicable, not on BB 

## 2012-01-24 NOTE — Transfer of Care (Signed)
Immediate Anesthesia Transfer of Care Note  Patient: Sara Boyer  Procedure(s) Performed: Procedure(s) (LRB): ROTATOR CUFF REPAIR SHOULDER OPEN (Left)  Patient Location: PACU  Anesthesia Type: General  Level of Consciousness: awake, sedated, patient cooperative and responds to stimulation  Airway & Oxygen Therapy: Patient Spontanous Breathing and Patient connected to face mask oxygen  Post-op Assessment: Report given to PACU RN, Post -op Vital signs reviewed and stable and Patient moving all extremities  Post vital signs: Reviewed and stable  Complications: No apparent anesthesia complications

## 2012-01-25 ENCOUNTER — Encounter (HOSPITAL_COMMUNITY): Payer: Self-pay | Admitting: Orthopedic Surgery

## 2012-01-25 MED ORDER — OXYCODONE-ACETAMINOPHEN 10-325 MG PO TABS: 1.0000 | ORAL_TABLET | ORAL | Status: AC | PRN

## 2012-01-25 NOTE — Progress Notes (Signed)
Subjective: Doing Well. Will DC today.   Objective: Vital signs in last 24 hours: Temp:  [97.5 F (36.4 C)-98.7 F (37.1 C)] 98.2 F (36.8 C) (08/29 0541) Pulse Rate:  [72-88] 77  (08/29 0541) Resp:  [9-20] 16  (08/29 0541) BP: (116-153)/(53-72) 135/62 mmHg (08/29 0541) SpO2:  [92 %-100 %] 93 % (08/29 0541) Weight:  [68.04 kg (150 lb)] 68.04 kg (150 lb) (08/28 1604)  Intake/Output from previous day: 08/28 0701 - 08/29 0700 In: 2840 [P.O.:360; I.V.:2375; IV Piggyback:105] Out: 50 [Blood:50] Intake/Output this shift: Total I/O In: 1585 [P.O.:360; I.V.:1225] Out: -   No results found for this basename: HGB:5 in the last 72 hours No results found for this basename: WBC:2,RBC:2,HCT:2,PLT:2 in the last 72 hours No results found for this basename: NA:2,K:2,CL:2,CO2:2,BUN:2,CREATININE:2,GLUCOSE:2,CALCIUM:2 in the last 72 hours No results found for this basename: LABPT:2,INR:2 in the last 72 hours  Neurovascular intact  Assessment/Plan: DC Today.   Serrena Linderman A 01/25/2012, 6:58 AM

## 2012-01-25 NOTE — Discharge Summary (Signed)
Physician Discharge Summary  Patient ID: Sara Boyer MRN: [id number] DOB/AGE: [birth year] 71 y.o.  Admit date: 01/24/2012 Discharge date: 01/25/2012  Admission Diagnoses:Complete Tear of Left Rotator Cuff.  Discharge Diagnoses:  Active Problems:  Complete rotator cuff tear of left shoulder   Discharged Condition: Improved  Hospital Course: No Complications  Consults:none  Significant Diagnostic Studies: Labs Only  Treatments:OT  Discharge Exam: Blood pressure 135/62, pulse 77, temperature 98.2 F (36.8 C), temperature source Oral, resp. rate 16, height 5\' 1"  (1.549 m), weight 68.04 kg (150 lb), SpO2 93.00%. Extremities: Good circulation in Hand and moves fingers well.  Disposition:   Discharge Orders    Future Orders Please Complete By Expires   Call MD / Call 911      Comments:   If you experience chest pain or shortness of breath, CALL 911 and be transported to the hospital emergency room.  If you develope a fever above 101 F, pus (white drainage) or increased drainage or redness at the wound, or calf pain, call your surgeon's office.   Increase activity slowly as tolerated      Discharge instructions      Comments:   Keep your sling on at all times, including sleeping in your sling.  The only time you should remove your sling is to shower only but you need to keep your hand against your chest while you shower.   For the first few days, remove your dressing, tape a piece of saran wrap over your incision, take your shower, then remove the saran wrap and put a clean dressing on, then reapply your sling.  After two days you can shower without the saran wrap.   Call Dr. Darrelyn Hillock if any wound complications or temperature of 101 degrees F or over.   Call the office for an appointment to see Dr. Darrelyn Hillock in two weeks: [phone] and ask for Dr. Jeannetta Ellis nurse, Mackey Birchwood.   Driving restrictions      Comments:   No driving for 4 weeks     Medication List  As  of 01/25/2012  7:04 AM   TAKE these medications         amLODipine 5 MG tablet   Commonly known as: NORVASC   Take 5 mg by mouth every morning.      aspirin EC 81 MG tablet   Take 81 mg by mouth daily.      CENTRUM SILVER PO   Take 1 tablet by mouth daily.      cholecalciferol 1000 UNITS tablet   Commonly known as: VITAMIN D   Take 1,000 Units by mouth daily.      co-enzyme Q-10 30 MG capsule   Take 30 mg by mouth 3 (three) times daily.      ketoconazole 2 % cream   Commonly known as: NIZORAL   Apply 1 application topically daily. For eczema on face      oxyCODONE-acetaminophen 10-325 MG per tablet   Commonly known as: PERCOCET   Take 1 tablet by mouth every 4 (four) hours as needed for pain.      potassium chloride SA 20 MEQ tablet   Commonly known as: K-DUR,KLOR-CON   Take 20 mEq by mouth every morning.      TRIPLE OMEGA-3-6-9 Caps   Take 1 capsule by mouth daily.      valsartan-hydrochlorothiazide 160-12.5 MG per tablet   Commonly known as: DIOVAN-HCT   Take 1 tablet by mouth every morning.  Vitamin B-Complex Tabs   Take by mouth. Vitamin c and folic acid by nature made             Signed: Jordyn Hofacker A 01/25/2012, 7:04 AM

## 2012-01-25 NOTE — Op Note (Signed)
NAMEWARRENE, KAPFER NO.:  [id number]  MEDICAL RECORD NO.:  [id number]  LOCATION:  1603                         FACILITY:  [facility]  PHYSICIAN:  Georges Lynch. Earma Nicolaou, M.D.DATE OF BIRTH:  1941-01-11  DATE OF PROCEDURE:  01/24/2012 DATE OF DISCHARGE:                              OPERATIVE REPORT   SURGEON:  Georges Lynch. Zackrey Dyar, MD  ASSISTANT:  OR tech.  PREOPERATIVE DIAGNOSES: 1. Complex tear of the rotator cuff tendon, left shoulder. 2. Severe impingement syndrome, left shoulder.  POSTOPERATIVE DIAGNOSES: 1. Complex tear of the rotator cuff tendon, left shoulder. 2. Severe impingement syndrome, left shoulder.  OPERATION: 1. Open acromionectomy and acromioplasty, left shoulder. 2. Repair of a complex tear of the rotator cuff tendon, left shoulder. 3. TissueMend graft utilizing graft in 1 anchor of the left rotator     cuff repair.  PROCEDURE:  Under general anesthesia, the patient was in a beach chair position.  The routine orthopedic prep and draping of the left shoulder was carried out.  She had 2 g of IV Ancef.  At this particular time, the appropriate time-out was carried out first, also marked the appropriate left arm in the holding area.  An incision then was made over the anterior aspect of left shoulder.  Bleeders identified and cauterized. I then went down, inserted self-retaining retractor.  I then dissected the deltoid tendon off of the acromion by sharp dissection.  A nice split of small proximal portion of the deltoid muscle.  At this time, she had severe down growth and sloping of the acromion with a tear of the rotator cuff.  This was a complex type tear.  I protected the underlying cuff with a Bennett retractor, utilized the oscillating saw and burr to do an acromionectomy and acromioplasty.  After that, I irrigated out the area and bone waxed the undersurface of the acromion. Following that, I went down, did a bursectomy.  The bursa was  quite thickened.  He then had a tear as I mentioned of the rotator cuff.  I brought that forward and sutured in place and then utilized a TissueMend graft in the usual fashion with 1 anchor to reinforce the repair.  The tendon also was quite thinned out at the insertion.  Thoroughly irrigated out the area and reapproximated deltoid tendon and muscle in usual fashion.  I injected 20 mL of Exparel into the wound site.  I then closed the deltoid, tendon, and muscle back in usual fashion. Subcu was closed with 0 Vicryl, skin with metal staples.  Sterile Neosporin dressing was applied.  She was placed in a shoulder immobilizer.          ______________________________ Georges Lynch Darrelyn Hillock, M.D.     RAG/MEDQ  D:  01/24/2012  T:  01/25/2012  Job:  [id number]

## 2012-01-25 NOTE — Evaluation (Signed)
Occupational Therapy Evaluation Patient Details Name: Sara Boyer MRN: [id number] DOB: 1941/05/27 Today's Date: 01/25/2012 Time: 1118-1203 OT Time Calculation (min): 45 min  OT Assessment / Plan / Recommendation Clinical Impression  Pt was dizzy during session and had to lie down X2 before being able to finish session. Pt later up with husband per her report to bathroom and said she felt much better. Nursing aware of above. Pt also with some nausea. All education completed with pt and wife.     OT Assessment  Patient does not need any further OT services    Follow Up Recommendations  No OT follow up;Supervision/Assistance - 24 hour    Barriers to Discharge      Equipment Recommendations  None recommended by OT    Recommendations for Other Services    Frequency       Precautions / Restrictions Precautions Precautions: Shoulder Precaution Booklet Issued: Yes (comment) Precaution Comments: shoulder care handout provided and reviewed. Required Braces or Orthoses: Other Brace/Splint Other Brace/Splint: L shoulder immobilizer Restrictions Weight Bearing Restrictions: Yes LUE Weight Bearing: Non weight bearing   Pertinent Vitals/Pain     ADL       OT Diagnosis:    OT Problem List:   OT Treatment Interventions:     OT Goals    Visit Information  Last OT Received On: 01/25/12 Assistance Needed: +1    Subjective Data  Subjective: I guess I am afraid of the pain Patient Stated Goal: home when able   Prior Functioning  Vision/Perception  Home Living Lives With: Spouse Available Help at Discharge: Family Communication Communication: No difficulties      Cognition  Overall Cognitive Status: Appears within functional limits for tasks assessed/performed Arousal/Alertness: Awake/alert Orientation Level: Appears intact for tasks assessed Behavior During Session: [facility] for tasks performed    Extremity/Trunk Assessment     Mobility  Shoulder Instructions    Donning/doffing shirt without moving shoulder: Caregiver independent with task Method for sponge bathing under operated UE: Patient able to independently direct caregiver Donning/doffing sling/immobilizer: Caregiver independent with task Correct positioning of sling/immobilizer: Caregiver independent with task ROM for elbow, wrist and digits of operated UE: Independent Sling wearing schedule (on at all times/off for ADL's): Independent Proper positioning of operated UE when showering: Independent Dressing change: Patient able to independently direct caregiver Positioning of UE while sleeping: Patient able to independently direct caregiver   Exercise     Balance     End of Session OT - End of Session Activity Tolerance: Patient limited by pain;Other (comment) (lightheadedness and nausea. nursing made aware.)  GO Functional Assessment Tool Used: clinical judgement Functional Limitation: Self care Self Care Current Status (N8295): At least 40 percent but less than 60 percent impaired, limited or restricted Self Care Goal Status (A2130): At least 40 percent but less than 60 percent impaired, limited or restricted Self Care Discharge Status [phone]): At least 40 percent but less than 60 percent impaired, limited or restricted   Lennox Laity [phone] 01/25/2012, 1:05 PM

## 2012-03-06 ENCOUNTER — Other Ambulatory Visit: Payer: Self-pay | Admitting: Internal Medicine

## 2012-03-06 DIAGNOSIS — Z1231 Encounter for screening mammogram for malignant neoplasm of breast: Secondary | ICD-10-CM

## 2012-03-18 ENCOUNTER — Ambulatory Visit: Payer: Medicare Other | Attending: Orthopedic Surgery | Admitting: Physical Therapy

## 2012-03-18 DIAGNOSIS — M25519 Pain in unspecified shoulder: Secondary | ICD-10-CM | POA: Insufficient documentation

## 2012-03-18 DIAGNOSIS — IMO0001 Reserved for inherently not codable concepts without codable children: Secondary | ICD-10-CM | POA: Insufficient documentation

## 2012-03-18 DIAGNOSIS — M25619 Stiffness of unspecified shoulder, not elsewhere classified: Secondary | ICD-10-CM | POA: Insufficient documentation

## 2012-03-18 DIAGNOSIS — R293 Abnormal posture: Secondary | ICD-10-CM | POA: Insufficient documentation

## 2012-03-20 ENCOUNTER — Ambulatory Visit: Payer: Medicare Other | Admitting: Physical Therapy

## 2012-03-26 ENCOUNTER — Ambulatory Visit: Payer: Medicare Other | Admitting: Physical Therapy

## 2012-03-27 ENCOUNTER — Other Ambulatory Visit: Payer: Self-pay | Admitting: Internal Medicine

## 2012-03-27 DIAGNOSIS — R1013 Epigastric pain: Secondary | ICD-10-CM

## 2012-03-27 DIAGNOSIS — R1011 Right upper quadrant pain: Secondary | ICD-10-CM

## 2012-03-28 ENCOUNTER — Ambulatory Visit: Payer: Medicare Other | Admitting: Physical Therapy

## 2012-04-01 ENCOUNTER — Ambulatory Visit: Payer: Medicare Other | Attending: Orthopedic Surgery | Admitting: Physical Therapy

## 2012-04-01 DIAGNOSIS — IMO0001 Reserved for inherently not codable concepts without codable children: Secondary | ICD-10-CM | POA: Insufficient documentation

## 2012-04-01 DIAGNOSIS — M25519 Pain in unspecified shoulder: Secondary | ICD-10-CM | POA: Insufficient documentation

## 2012-04-01 DIAGNOSIS — M25619 Stiffness of unspecified shoulder, not elsewhere classified: Secondary | ICD-10-CM | POA: Insufficient documentation

## 2012-04-01 DIAGNOSIS — R293 Abnormal posture: Secondary | ICD-10-CM | POA: Insufficient documentation

## 2012-04-02 ENCOUNTER — Ambulatory Visit
Admission: RE | Admit: 2012-04-02 | Discharge: 2012-04-02 | Disposition: A | Discharge status: Home / Self Care | Payer: Medicare Other | Source: Ambulatory Visit | Attending: Internal Medicine | Admitting: Internal Medicine

## 2012-04-02 ENCOUNTER — Other Ambulatory Visit: Payer: Self-pay | Admitting: Gastroenterology

## 2012-04-02 DIAGNOSIS — R109 Unspecified abdominal pain: Secondary | ICD-10-CM

## 2012-04-02 DIAGNOSIS — R1011 Right upper quadrant pain: Secondary | ICD-10-CM

## 2012-04-02 DIAGNOSIS — R1013 Epigastric pain: Secondary | ICD-10-CM

## 2012-04-03 ENCOUNTER — Ambulatory Visit: Payer: Medicare Other | Admitting: Physical Therapy

## 2012-04-04 ENCOUNTER — Ambulatory Visit
Admission: RE | Admit: 2012-04-04 | Discharge: 2012-04-04 | Disposition: A | Discharge status: Home / Self Care | Payer: Medicare Other | Source: Ambulatory Visit | Attending: Gastroenterology | Admitting: Gastroenterology

## 2012-04-04 DIAGNOSIS — R109 Unspecified abdominal pain: Secondary | ICD-10-CM

## 2012-04-04 MED ORDER — IOHEXOL 300 MG/ML  SOLN
100.0000 mL | Freq: Once | INTRAMUSCULAR | Status: AC | PRN
  Administered 2012-04-04: 100 mL via INTRAVENOUS

## 2012-04-08 ENCOUNTER — Ambulatory Visit: Payer: Medicare Other | Admitting: Physical Therapy

## 2012-04-09 ENCOUNTER — Other Ambulatory Visit: Payer: Self-pay | Admitting: Gastroenterology

## 2012-04-11 ENCOUNTER — Ambulatory Visit: Payer: Medicare Other | Admitting: Physical Therapy

## 2012-04-15 ENCOUNTER — Ambulatory Visit: Payer: Medicare Other | Admitting: Physical Therapy

## 2012-04-17 ENCOUNTER — Ambulatory Visit: Payer: Medicare Other

## 2012-04-18 ENCOUNTER — Ambulatory Visit: Payer: Medicare Other | Admitting: Physical Therapy

## 2012-04-18 ENCOUNTER — Ambulatory Visit: Payer: Medicare Other

## 2012-04-18 ENCOUNTER — Ambulatory Visit
Admission: RE | Admit: 2012-04-18 | Discharge: 2012-04-18 | Disposition: A | Discharge status: Home / Self Care | Payer: Medicare Other | Source: Ambulatory Visit | Attending: Internal Medicine | Admitting: Internal Medicine

## 2012-04-18 DIAGNOSIS — Z1231 Encounter for screening mammogram for malignant neoplasm of breast: Secondary | ICD-10-CM

## 2012-04-29 ENCOUNTER — Ambulatory Visit: Payer: Medicare Other | Attending: Orthopedic Surgery | Admitting: Physical Therapy

## 2012-04-29 DIAGNOSIS — M25519 Pain in unspecified shoulder: Secondary | ICD-10-CM | POA: Insufficient documentation

## 2012-04-29 DIAGNOSIS — IMO0001 Reserved for inherently not codable concepts without codable children: Secondary | ICD-10-CM | POA: Insufficient documentation

## 2012-04-29 DIAGNOSIS — M25619 Stiffness of unspecified shoulder, not elsewhere classified: Secondary | ICD-10-CM | POA: Insufficient documentation

## 2012-04-29 DIAGNOSIS — R293 Abnormal posture: Secondary | ICD-10-CM | POA: Insufficient documentation

## 2012-05-01 ENCOUNTER — Ambulatory Visit: Payer: Medicare Other | Admitting: Physical Therapy

## 2012-05-07 ENCOUNTER — Ambulatory Visit: Payer: Medicare Other | Admitting: Physical Therapy

## 2012-05-08 ENCOUNTER — Ambulatory Visit: Payer: Medicare Other | Admitting: Physical Therapy

## 2012-05-13 ENCOUNTER — Encounter: Payer: Medicare Other | Admitting: Physical Therapy

## 2012-05-14 ENCOUNTER — Ambulatory Visit: Payer: Medicare Other | Admitting: Physical Therapy

## 2012-05-15 ENCOUNTER — Encounter: Payer: Medicare Other | Admitting: Physical Therapy

## 2012-05-16 ENCOUNTER — Ambulatory Visit: Payer: Medicare Other | Admitting: Physical Therapy

## 2012-05-16 ENCOUNTER — Encounter: Payer: Medicare Other | Admitting: Physical Therapy

## 2013-03-28 ENCOUNTER — Other Ambulatory Visit: Payer: Self-pay

## 2013-03-28 DIAGNOSIS — Z1231 Encounter for screening mammogram for malignant neoplasm of breast: Secondary | ICD-10-CM

## 2013-04-28 ENCOUNTER — Ambulatory Visit
Admission: RE | Admit: 2013-04-28 | Discharge: 2013-04-28 | Disposition: A | Discharge status: Home / Self Care | Payer: Medicare Other | Source: Ambulatory Visit

## 2013-04-28 DIAGNOSIS — Z1231 Encounter for screening mammogram for malignant neoplasm of breast: Secondary | ICD-10-CM

## 2014-03-31 ENCOUNTER — Other Ambulatory Visit: Payer: Self-pay

## 2014-03-31 DIAGNOSIS — Z1231 Encounter for screening mammogram for malignant neoplasm of breast: Secondary | ICD-10-CM

## 2014-04-29 ENCOUNTER — Ambulatory Visit
Admission: RE | Admit: 2014-04-29 | Discharge: 2014-04-29 | Disposition: A | Discharge status: Home / Self Care | Payer: Commercial Managed Care - HMO | Source: Ambulatory Visit

## 2014-04-29 DIAGNOSIS — Z1231 Encounter for screening mammogram for malignant neoplasm of breast: Secondary | ICD-10-CM

## 2015-03-29 ENCOUNTER — Other Ambulatory Visit: Payer: Self-pay | Admitting: *Deleted

## 2015-03-29 DIAGNOSIS — I83812 Varicose veins of left lower extremities with pain: Secondary | ICD-10-CM

## 2015-04-05 ENCOUNTER — Other Ambulatory Visit: Payer: Self-pay

## 2015-04-05 DIAGNOSIS — Z1231 Encounter for screening mammogram for malignant neoplasm of breast: Secondary | ICD-10-CM

## 2015-04-26 ENCOUNTER — Encounter: Payer: Self-pay | Admitting: Vascular Surgery

## 2015-04-27 ENCOUNTER — Encounter: Payer: Self-pay | Admitting: Vascular Surgery

## 2015-04-27 ENCOUNTER — Ambulatory Visit (HOSPITAL_COMMUNITY)
Admission: RE | Admit: 2015-04-27 | Discharge: 2015-04-27 | Disposition: A | Discharge status: Home / Self Care | Payer: Commercial Managed Care - HMO | Source: Ambulatory Visit | Attending: Vascular Surgery | Admitting: Vascular Surgery

## 2015-04-27 ENCOUNTER — Ambulatory Visit (INDEPENDENT_AMBULATORY_CARE_PROVIDER_SITE_OTHER): Payer: Commercial Managed Care - HMO | Admitting: Vascular Surgery

## 2015-04-27 VITALS — BP 158/76 | HR 63 | Temp 98.0°F | Resp 16 | Ht 61.5 in | Wt 155.0 lb

## 2015-04-27 DIAGNOSIS — I83812 Varicose veins of left lower extremities with pain: Secondary | ICD-10-CM | POA: Diagnosis not present

## 2015-04-27 DIAGNOSIS — I83892 Varicose veins of left lower extremities with other complications: Secondary | ICD-10-CM | POA: Insufficient documentation

## 2015-04-27 NOTE — Progress Notes (Signed)
Subjective:     Patient ID: Sara Boyer, female   DOB: Jun 02, 1940, 74 y.o.   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 She has developed some aching and throbbing discomfort as the day progresses. She will develop some mild edema distally. She has no history of DVT thrombophlebitis stasis ulcers or bleeding. She is not last to compression stockings. She has no symptoms and contralateral right leg.  Past Medical History  Diagnosis Date  . Hypertension   . Shortness of breath     with exertion  . Pneumonia     hx of   . Anemia     hx of as a child     Social History  Substance Use Topics  . Smoking status: Never Smoker   . Smokeless tobacco: Never Used  . Alcohol Use: Yes     Comment: occasional glass of wine or cocktail     History reviewed. No pertinent family history.  Allergies  Allergen Reactions  . Tetanus Toxoids   . Strawberry Extract Rash and Other (See Comments)    burning when touched on skin     Current outpatient prescriptions:  .  amLODipine (NORVASC) 5 MG tablet, Take 5 mg by mouth every morning., Disp: , Rfl:  .  aspirin EC 81 MG tablet, Take 81 mg by mouth daily., Disp: , Rfl:  .  B Complex Vitamins (VITAMIN B-COMPLEX) TABS, Take by mouth. Vitamin c and folic acid by nature made, Disp: , Rfl:  .  cholecalciferol (VITAMIN D) 1000 UNITS tablet, Take 1,000 Units by mouth daily., Disp: , Rfl:  .  co-enzyme Q-10 30 MG capsule, Take 30 mg by mouth 3 (three) times daily., Disp: , Rfl:  .  ketoconazole (NIZORAL) 2 % cream, Apply 1 application topically daily. For eczema on face, Disp: , Rfl:  .  Multiple Vitamins-Minerals (CENTRUM SILVER PO), Take 1 tablet by mouth daily., Disp: , Rfl:  .  Omega 3-6-9 Fatty Acids (TRIPLE OMEGA-3-6-9) CAPS, Take 1 capsule by mouth daily., Disp: , Rfl:  .   potassium chloride SA (K-DUR,KLOR-CON) 20 MEQ tablet, Take 20 mEq by mouth every morning., Disp: , Rfl:  .  valsartan-hydrochlorothiazide (DIOVAN-HCT) 160-12.5 MG per tablet, Take 1 tablet by mouth every morning., Disp: , Rfl:   Filed Vitals:   04/27/15 0959 04/27/15 1000  BP: 152/76 158/76  Pulse: 66 63  Temp: 98 F (36.7 C)   Resp: 16   Height: 5' 1.5" (1.562 m)   Weight: 155 lb (70.308 kg)   SpO2: 98%     Body mass index is 28.82 kg/(m^2).           Review of Systems Denies chest pain but does have dyspnea on exertion. Denies hemoptysis or claudication.     Objective:   Physical Exam BP 158/76 mmHg  Pulse 63  Temp(Src) 98 F (36.7 C)  Resp 16  Ht 5' 1.5" (1.562 m)  Wt 155 lb (70.308 kg)  BMI 28.82 kg/m2  SpO2 98%  Gen.-alert and oriented x3 in no apparent distress HEENT normal for age Lungs no rhonchi or wheezing Cardiovascular regular rhythm no murmurs carotid pulses 3+ palpable no bruits audible Abdomen soft nontender no palpable masses Musculoskeletal free of  major deformities Skin clear -no rashes Neurologic normal Lower extremities 3+ femoral and dorsalis pedis pulses palpable bilaterally  with no edema  Lateral varicosity in the proximal thigh extending down to just above the knee. No hyperpigmentation distally. Spider veins medial thigh bilaterally. No active ulceration. 3+ dorsalis pedis pulse palpable bilaterally.  Today I ordered a venous duplex exam of the left leg which I reviewed and interpreted. There is no DVT. no reflux in the left great saphenous vein except some at the junction. This lateral varix on the lateral thigh does have reflux and we're unable to determine what the source of this is. It could be coming from the pelvic area.       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

## 2015-04-27 NOTE — Progress Notes (Signed)
Filed Vitals:   04/27/15 0959 04/27/15 1000  BP: 152/76 158/76  Pulse: 66 63  Temp: 98 F (36.7 C)   Resp: 16   Height: 5' 1.5" (1.562 m)   Weight: 155 lb (70.308 kg)   SpO2: 98%

## 2015-05-06 ENCOUNTER — Ambulatory Visit
Admission: RE | Admit: 2015-05-06 | Discharge: 2015-05-06 | Disposition: A | Discharge status: Home / Self Care | Payer: Commercial Managed Care - HMO | Source: Ambulatory Visit

## 2015-05-06 DIAGNOSIS — Z1231 Encounter for screening mammogram for malignant neoplasm of breast: Secondary | ICD-10-CM

## 2015-06-22 DIAGNOSIS — G5601 Carpal tunnel syndrome, right upper limb: Secondary | ICD-10-CM | POA: Diagnosis not present

## 2015-06-30 DIAGNOSIS — Z4789 Encounter for other orthopedic aftercare: Secondary | ICD-10-CM | POA: Diagnosis not present

## 2015-06-30 DIAGNOSIS — G5601 Carpal tunnel syndrome, right upper limb: Secondary | ICD-10-CM | POA: Diagnosis not present

## 2015-07-06 DIAGNOSIS — G5601 Carpal tunnel syndrome, right upper limb: Secondary | ICD-10-CM | POA: Diagnosis not present

## 2015-07-19 DIAGNOSIS — G5601 Carpal tunnel syndrome, right upper limb: Secondary | ICD-10-CM | POA: Diagnosis not present

## 2015-09-28 DIAGNOSIS — Z1389 Encounter for screening for other disorder: Secondary | ICD-10-CM | POA: Diagnosis not present

## 2015-09-28 DIAGNOSIS — E890 Postprocedural hypothyroidism: Secondary | ICD-10-CM | POA: Diagnosis not present

## 2015-09-28 DIAGNOSIS — Z0000 Encounter for general adult medical examination without abnormal findings: Secondary | ICD-10-CM | POA: Diagnosis not present

## 2015-09-28 DIAGNOSIS — I10 Essential (primary) hypertension: Secondary | ICD-10-CM | POA: Diagnosis not present

## 2015-09-28 DIAGNOSIS — R21 Rash and other nonspecific skin eruption: Secondary | ICD-10-CM | POA: Diagnosis not present

## 2016-04-06 ENCOUNTER — Other Ambulatory Visit: Payer: Self-pay | Admitting: Internal Medicine

## 2016-04-06 DIAGNOSIS — Z1231 Encounter for screening mammogram for malignant neoplasm of breast: Secondary | ICD-10-CM

## 2016-04-13 DIAGNOSIS — R5383 Other fatigue: Secondary | ICD-10-CM | POA: Diagnosis not present

## 2016-04-13 DIAGNOSIS — I10 Essential (primary) hypertension: Secondary | ICD-10-CM | POA: Diagnosis not present

## 2016-04-13 DIAGNOSIS — Z23 Encounter for immunization: Secondary | ICD-10-CM | POA: Diagnosis not present

## 2016-05-11 ENCOUNTER — Ambulatory Visit
Admission: RE | Admit: 2016-05-11 | Discharge: 2016-05-11 | Disposition: A | Discharge status: Home / Self Care | Payer: PPO | Source: Ambulatory Visit | Attending: Internal Medicine | Admitting: Internal Medicine

## 2016-05-11 DIAGNOSIS — Z1231 Encounter for screening mammogram for malignant neoplasm of breast: Secondary | ICD-10-CM

## 2016-10-05 DIAGNOSIS — Z0000 Encounter for general adult medical examination without abnormal findings: Secondary | ICD-10-CM | POA: Diagnosis not present

## 2016-10-05 DIAGNOSIS — Z1389 Encounter for screening for other disorder: Secondary | ICD-10-CM | POA: Diagnosis not present

## 2016-10-05 DIAGNOSIS — Z1211 Encounter for screening for malignant neoplasm of colon: Secondary | ICD-10-CM | POA: Diagnosis not present

## 2016-10-05 DIAGNOSIS — R197 Diarrhea, unspecified: Secondary | ICD-10-CM | POA: Diagnosis not present

## 2016-10-05 DIAGNOSIS — I10 Essential (primary) hypertension: Secondary | ICD-10-CM | POA: Diagnosis not present

## 2016-10-18 DIAGNOSIS — S30861A Insect bite (nonvenomous) of abdominal wall, initial encounter: Secondary | ICD-10-CM | POA: Diagnosis not present

## 2016-10-18 DIAGNOSIS — L089 Local infection of the skin and subcutaneous tissue, unspecified: Secondary | ICD-10-CM | POA: Diagnosis not present

## 2016-10-18 DIAGNOSIS — W57XXXA Bitten or stung by nonvenomous insect and other nonvenomous arthropods, initial encounter: Secondary | ICD-10-CM | POA: Diagnosis not present

## 2016-12-13 DIAGNOSIS — D1801 Hemangioma of skin and subcutaneous tissue: Secondary | ICD-10-CM | POA: Diagnosis not present

## 2016-12-13 DIAGNOSIS — L659 Nonscarring hair loss, unspecified: Secondary | ICD-10-CM | POA: Diagnosis not present

## 2016-12-13 DIAGNOSIS — L821 Other seborrheic keratosis: Secondary | ICD-10-CM | POA: Diagnosis not present

## 2017-02-19 DIAGNOSIS — L821 Other seborrheic keratosis: Secondary | ICD-10-CM | POA: Diagnosis not present

## 2017-04-09 ENCOUNTER — Other Ambulatory Visit: Payer: Self-pay | Admitting: Internal Medicine

## 2017-04-09 DIAGNOSIS — Z1231 Encounter for screening mammogram for malignant neoplasm of breast: Secondary | ICD-10-CM

## 2017-04-10 DIAGNOSIS — D126 Benign neoplasm of colon, unspecified: Secondary | ICD-10-CM | POA: Diagnosis not present

## 2017-04-10 DIAGNOSIS — L659 Nonscarring hair loss, unspecified: Secondary | ICD-10-CM | POA: Diagnosis not present

## 2017-04-10 DIAGNOSIS — Z23 Encounter for immunization: Secondary | ICD-10-CM | POA: Diagnosis not present

## 2017-04-10 DIAGNOSIS — I10 Essential (primary) hypertension: Secondary | ICD-10-CM | POA: Diagnosis not present

## 2017-05-14 ENCOUNTER — Ambulatory Visit
Admission: RE | Admit: 2017-05-14 | Discharge: 2017-05-14 | Disposition: A | Discharge status: Home / Self Care | Payer: PPO | Source: Ambulatory Visit | Attending: Internal Medicine | Admitting: Internal Medicine

## 2017-05-14 DIAGNOSIS — Z1231 Encounter for screening mammogram for malignant neoplasm of breast: Secondary | ICD-10-CM

## 2017-06-06 DIAGNOSIS — Z5181 Encounter for therapeutic drug level monitoring: Secondary | ICD-10-CM | POA: Diagnosis not present

## 2017-08-24 DIAGNOSIS — H5213 Myopia, bilateral: Secondary | ICD-10-CM | POA: Diagnosis not present

## 2017-08-24 DIAGNOSIS — Z961 Presence of intraocular lens: Secondary | ICD-10-CM | POA: Diagnosis not present

## 2017-08-24 DIAGNOSIS — H52221 Regular astigmatism, right eye: Secondary | ICD-10-CM | POA: Diagnosis not present

## 2017-08-30 ENCOUNTER — Other Ambulatory Visit: Payer: Self-pay | Admitting: Gastroenterology

## 2017-08-30 DIAGNOSIS — Z86010 Personal history of colonic polyps: Secondary | ICD-10-CM | POA: Diagnosis not present

## 2017-08-30 DIAGNOSIS — R197 Diarrhea, unspecified: Secondary | ICD-10-CM

## 2017-09-13 ENCOUNTER — Ambulatory Visit
Admission: RE | Admit: 2017-09-13 | Discharge: 2017-09-13 | Disposition: A | Discharge status: Home / Self Care | Payer: PPO | Source: Ambulatory Visit | Attending: Gastroenterology | Admitting: Gastroenterology

## 2017-09-13 DIAGNOSIS — Z86010 Personal history of colonic polyps: Secondary | ICD-10-CM

## 2017-09-13 DIAGNOSIS — K449 Diaphragmatic hernia without obstruction or gangrene: Secondary | ICD-10-CM | POA: Diagnosis not present

## 2017-09-13 DIAGNOSIS — R197 Diarrhea, unspecified: Secondary | ICD-10-CM

## 2017-09-25 DIAGNOSIS — K649 Unspecified hemorrhoids: Secondary | ICD-10-CM | POA: Diagnosis not present

## 2017-09-25 DIAGNOSIS — K5730 Diverticulosis of large intestine without perforation or abscess without bleeding: Secondary | ICD-10-CM | POA: Diagnosis not present

## 2017-09-25 DIAGNOSIS — R933 Abnormal findings on diagnostic imaging of other parts of digestive tract: Secondary | ICD-10-CM | POA: Diagnosis not present

## 2017-09-25 DIAGNOSIS — Z86010 Personal history of colonic polyps: Secondary | ICD-10-CM | POA: Diagnosis not present

## 2017-10-15 DIAGNOSIS — R221 Localized swelling, mass and lump, neck: Secondary | ICD-10-CM | POA: Diagnosis not present

## 2017-10-15 DIAGNOSIS — Z0000 Encounter for general adult medical examination without abnormal findings: Secondary | ICD-10-CM | POA: Diagnosis not present

## 2017-10-15 DIAGNOSIS — Z1389 Encounter for screening for other disorder: Secondary | ICD-10-CM | POA: Diagnosis not present

## 2017-10-15 DIAGNOSIS — I10 Essential (primary) hypertension: Secondary | ICD-10-CM | POA: Diagnosis not present

## 2017-11-05 DIAGNOSIS — L249 Irritant contact dermatitis, unspecified cause: Secondary | ICD-10-CM | POA: Diagnosis not present

## 2018-01-04 DIAGNOSIS — M5136 Other intervertebral disc degeneration, lumbar region: Secondary | ICD-10-CM | POA: Diagnosis not present

## 2018-01-04 DIAGNOSIS — M47817 Spondylosis without myelopathy or radiculopathy, lumbosacral region: Secondary | ICD-10-CM | POA: Diagnosis not present

## 2018-01-04 DIAGNOSIS — M545 Low back pain: Secondary | ICD-10-CM | POA: Diagnosis not present

## 2018-01-11 DIAGNOSIS — M545 Low back pain: Secondary | ICD-10-CM | POA: Diagnosis not present

## 2018-01-16 DIAGNOSIS — M5136 Other intervertebral disc degeneration, lumbar region: Secondary | ICD-10-CM | POA: Diagnosis not present

## 2018-01-16 DIAGNOSIS — M47817 Spondylosis without myelopathy or radiculopathy, lumbosacral region: Secondary | ICD-10-CM | POA: Diagnosis not present

## 2018-02-05 DIAGNOSIS — M48061 Spinal stenosis, lumbar region without neurogenic claudication: Secondary | ICD-10-CM | POA: Diagnosis not present

## 2018-02-05 DIAGNOSIS — M5136 Other intervertebral disc degeneration, lumbar region: Secondary | ICD-10-CM | POA: Diagnosis not present

## 2018-02-21 DIAGNOSIS — M48061 Spinal stenosis, lumbar region without neurogenic claudication: Secondary | ICD-10-CM | POA: Diagnosis not present

## 2018-02-21 DIAGNOSIS — M5136 Other intervertebral disc degeneration, lumbar region: Secondary | ICD-10-CM | POA: Diagnosis not present

## 2018-03-06 DIAGNOSIS — M5416 Radiculopathy, lumbar region: Secondary | ICD-10-CM | POA: Diagnosis not present

## 2018-03-11 DIAGNOSIS — M48061 Spinal stenosis, lumbar region without neurogenic claudication: Secondary | ICD-10-CM | POA: Diagnosis not present

## 2018-03-11 DIAGNOSIS — M47817 Spondylosis without myelopathy or radiculopathy, lumbosacral region: Secondary | ICD-10-CM | POA: Diagnosis not present

## 2018-03-11 DIAGNOSIS — M5136 Other intervertebral disc degeneration, lumbar region: Secondary | ICD-10-CM | POA: Diagnosis not present

## 2018-03-11 DIAGNOSIS — M545 Low back pain: Secondary | ICD-10-CM | POA: Diagnosis not present

## 2018-03-13 DIAGNOSIS — M48061 Spinal stenosis, lumbar region without neurogenic claudication: Secondary | ICD-10-CM | POA: Diagnosis not present

## 2018-03-13 DIAGNOSIS — M5136 Other intervertebral disc degeneration, lumbar region: Secondary | ICD-10-CM | POA: Diagnosis not present

## 2018-03-14 DIAGNOSIS — M545 Low back pain: Secondary | ICD-10-CM | POA: Diagnosis not present

## 2018-03-18 DIAGNOSIS — M545 Low back pain: Secondary | ICD-10-CM | POA: Diagnosis not present

## 2018-03-21 DIAGNOSIS — M545 Low back pain: Secondary | ICD-10-CM | POA: Diagnosis not present

## 2018-03-25 ENCOUNTER — Ambulatory Visit (INDEPENDENT_AMBULATORY_CARE_PROVIDER_SITE_OTHER): Payer: PPO | Admitting: Internal Medicine

## 2018-03-25 ENCOUNTER — Encounter: Payer: Self-pay | Admitting: Internal Medicine

## 2018-03-25 VITALS — BP 110/60 | HR 67 | Ht 61.0 in | Wt 159.0 lb

## 2018-03-25 DIAGNOSIS — Z9009 Acquired absence of other part of head and neck: Secondary | ICD-10-CM | POA: Diagnosis not present

## 2018-03-25 DIAGNOSIS — E890 Postprocedural hypothyroidism: Secondary | ICD-10-CM

## 2018-03-25 DIAGNOSIS — L659 Nonscarring hair loss, unspecified: Secondary | ICD-10-CM

## 2018-03-25 NOTE — Patient Instructions (Addendum)
Stop Biotin, Ultra Hair, hair skin and nails and B complex now. You can continue the vitamin C and the Multivitamin.  Come back in 2 weeks for labs.  Afterwards you can resume only the Ultra Hair.  You can try: - Scalp concealer - Quilib spray   We will schedule a new appt if the labs are abnormal.

## 2018-03-25 NOTE — Progress Notes (Addendum)
Patient ID: Sara Boyer, female   DOB: 1940/06/06, 77 y.o.   MRN: [id number]    HPI  Sara Boyer is a 77 y.o.-year-old female, referred by her PCP, Dr. Laurann Montana, for ?hypothyroidism and hair loss.  In 1989, she had R lobectomy for a recurring cyst. She was then started on LT4 >> could not tolerate b/c palpitations.  She recently started to develop hair loss, dry skin, broken nails, night sweats, fatigue.  She is wondering if her thyroid function is abnormal.  For the hair loss, she tried Rogaine >> developed a scalp crust >> stopped.  She also is on Biotin 5000 mcg, Hair Skin and Nails, Ultra Nourish Hair (1200 mcg biotin per dose), B complex, multivitamin (Centrum Silver).  No history of chronic steroid use.  She had a steroid inj 2 weeks ago - in back.   I reviewed pt's thyroid tests: 04/13/2016: TSH 1.310, free T4 1.360, both normal No results found for: TSH, FREET4, T3FREE  Pt denies feeling nodules in neck, hoarseness, dysphagia/odynophagia, SOB with lying down.  No h/o radiation tx to head or neck. No recent use of iodine supplements.  She also has a history of hypertension.  She is on chlorthalidone, started approximately a year ago per her recall.  She has no known family history-she is adopted.  ROS: Constitutional: + See HPI, + nocturia Eyes: no blurry vision, no xerophthalmia ENT: no sore throat, no nodules palpated in throat, no dysphagia/odynophagia, no hoarseness, + decreased hearing Cardiovascular: no CP/SOB/palpitations/+ occasional leg swelling Respiratory: no cough/SOB Gastrointestinal: no N/V/D/C/+ heartburn Musculoskeletal: no muscle/joint aches Skin: no rashes, + hair loss, + itching Neurological: no tremors/numbness/tingling/dizziness Psychiatric: no depression/+ anxiety  Past Medical History:  Diagnosis Date  . Anemia    hx of as a child   . Hypertension   . Pneumonia    hx of   . Shortness of breath    with exertion   Past Surgical  History:  Procedure Laterality Date  . ABDOMINAL HYSTERECTOMY    . APPENDECTOMY    . BREAST CYST EXCISION Right   . BREAST SURGERY     right breast surgery for benign lump   . corrective surgery to open fallopian tubes     . DILATION AND CURETTAGE OF UTERUS     several   . HEMORRHOID SURGERY    . lobe removed from thyroid     . SHOULDER OPEN ROTATOR CUFF REPAIR  01/24/2012   Procedure: ROTATOR CUFF REPAIR SHOULDER OPEN;  Surgeon: Tobi Bastos, MD;  Location: [facility];  Service: Orthopedics;  Laterality: Left;  Left Shoulder Rotator Cuff Repair with Graft and Anchors and acrominectomy  . TONSILLECTOMY     Social History   Socioeconomic History  . Marital status: Married    Spouse name: Not on file  . Number of children: 2  . Years of education: Not on file  . Highest education level: Not on file  Occupational History  .  Retired  Scientific laboratory technician  . Financial resource strain: Not on file  . Food insecurity:    Worry: Not on file    Inability: Not on file  . Transportation needs:    Medical: Not on file    Non-medical: Not on file  Tobacco Use  . Smoking status: Never Smoker  . Smokeless tobacco: Never Used  Substance and Sexual Activity  . Alcohol use: Yes    Comment: occasional glass of wine or cocktail-once a week  . Drug  use: No  . Sexual activity: Not on file   Current Outpatient Medications on File Prior to Visit  Medication Sig Dispense Refill  . B Complex Vitamins (VITAMIN B-COMPLEX) TABS Take by mouth. Vitamin c and folic acid by nature made    . cholecalciferol (VITAMIN D) 1000 UNITS tablet Take 1,000 Units by mouth daily.    . Multiple Vitamins-Minerals (CENTRUM SILVER PO) Take 1 tablet by mouth daily.    . potassium chloride SA (K-DUR,KLOR-CON) 20 MEQ tablet Take 20 mEq by mouth every morning.    Marland Kitchen amLODipine (NORVASC) 5 MG tablet Take 5 mg by mouth every morning.    Marland Kitchen aspirin EC 81 MG tablet Take 81 mg by mouth daily.    . chlorthalidone (HYGROTON) 25 MG  tablet chlorthalidone 25 mg tablet    . co-enzyme Q-10 30 MG capsule Take 30 mg by mouth 3 (three) times daily.    Marland Kitchen ketoconazole (NIZORAL) 2 % cream Apply 1 application topically daily. For eczema on face    . Omega 3-6-9 Fatty Acids (TRIPLE OMEGA-3-6-9) CAPS Take 1 capsule by mouth daily.    . valsartan-hydrochlorothiazide (DIOVAN-HCT) 160-12.5 MG per tablet Take 1 tablet by mouth every morning.     No current facility-administered medications on file prior to visit.    Allergies  Allergen Reactions  . Tetanus Toxoids   . Strawberry Extract Rash and Other (See Comments)    burning when touched on skin   No family history on file.  PE: BP 110/60   Pulse 67   Ht 5\' 1"  (1.549 m) Comment: measured  Wt 159 lb (72.1 kg)   SpO2 97%   BMI 30.04 kg/m  Wt Readings from Last 3 Encounters:  03/25/18 159 lb (72.1 kg)  04/27/15 155 lb (70.3 kg)  01/24/12 150 lb (68 kg)   Constitutional: overweight, in NAD Eyes: PERRLA, EOMI, no exophthalmos ENT: moist mucous membranes, no thyromegaly, no cervical lymphadenopathy Cardiovascular: RRR, No MRG Respiratory: CTA B Gastrointestinal: abdomen soft, NT, ND, BS+ Musculoskeletal: no deformities, strength intact in all 4 Skin: moist, warm, no rashes Neurological: no tremor with outstretched hands, DTR normal in all 4  ASSESSMENT: 1. S/p thyroid lobectomy  2.  Hair loss  PLAN:  1. Patient with history of thyroid lobectomy with subsequent euthyroid investigation.   - Last set of labs was normal, but this was 2 years ago so we will repeat these today.  Per her request, we will also check thyroid antibodies. - We discussed that if her TFTs are normal, we cannot attribute the hair loss to thyroid dysfunction  2.  Hair loss We discussed about possible causes of alopecia:  Hypothyroidism - will check TFTs today  Pregnancy - she is 77 years old  Menopause   Poor diet   Stress   Anemia  Vitamin deficiencies - especially B12 vitamin and  B6 vitamin; she is not taking excess vitamin A -she is taking a multivitamin daily  Micronutrient deficiencies - will check a ferritin to see if this is low, in this case she would benefit from iron supplementation; will also check zinc since deficiency was also associated with hair loss -she is on chlorthalidone which can be conducive to losing deficiency, but she is on a multivitamin daily  Advised her to get all essential amino acids from the diet (especially L-lysine, since a deficiency of this amino acid has been living with hair loss).  She does not have any specific dietary restrictions.  Medications - Advised  to try to use scalp concealer and also recommended Quilib spray -Today we will check the following labs: Orders Placed This Encounter  Procedures  . TSH  . T4, free  . T3, free  . Ferritin  . Vitamin B12  . Vitamin B6  . Zinc  . Thyroglobulin antibody  . Thyroid peroxidase antibody  . CBC   Addendum (04/02/2018): Patient did not have the labs drawn yet. I will addend the results when they become available.  Component     Latest Ref Rng & Units 04/10/2018  WBC     4.0 - 10.5 K/uL 5.7  RBC     3.87 - 5.11 Mil/uL 4.47  Platelets     150.0 - 400.0 K/uL 244.0  Hemoglobin     12.0 - 15.0 g/dL 13.7  HCT     36.0 - 46.0 % 39.0  MCV     78.0 - 100.0 fl 87.4  MCHC     30.0 - 36.0 g/dL 35.2  RDW     11.5 - 15.5 % 13.8  Thyroperoxidase Ab SerPl-aCnc     <9 IU/mL <1  Thyroglobulin Ab     < or = 1 IU/mL <1  Zinc     60 - 130 mcg/dL 71  Vitamin B6     2.1 - 21.7 ng/mL 67.3 (H)  Vitamin B12     211 - 911 pg/mL 305  Ferritin     10.0 - 291.0 ng/mL 100.2  Triiodothyronine,Free,Serum     2.3 - 4.2 pg/mL 2.8  T4,Free(Direct)     0.60 - 1.60 ng/dL 0.79  TSH     0.35 - 4.50 uIU/mL 1.52   Normal labs except high B6 >> will advise her to stop any supplement with B6. Will advised her to follow up with PCP for this.  Philemon Kingdom, MD PhD [facility]

## 2018-03-27 DIAGNOSIS — Z23 Encounter for immunization: Secondary | ICD-10-CM | POA: Diagnosis not present

## 2018-04-02 DIAGNOSIS — L659 Nonscarring hair loss, unspecified: Secondary | ICD-10-CM | POA: Insufficient documentation

## 2018-04-02 DIAGNOSIS — Z9009 Acquired absence of other part of head and neck: Secondary | ICD-10-CM | POA: Insufficient documentation

## 2018-04-02 DIAGNOSIS — E890 Postprocedural hypothyroidism: Secondary | ICD-10-CM | POA: Insufficient documentation

## 2018-04-10 ENCOUNTER — Other Ambulatory Visit (INDEPENDENT_AMBULATORY_CARE_PROVIDER_SITE_OTHER): Payer: PPO

## 2018-04-10 DIAGNOSIS — Z9009 Acquired absence of other part of head and neck: Secondary | ICD-10-CM | POA: Diagnosis not present

## 2018-04-10 DIAGNOSIS — L659 Nonscarring hair loss, unspecified: Secondary | ICD-10-CM

## 2018-04-10 DIAGNOSIS — E890 Postprocedural hypothyroidism: Secondary | ICD-10-CM

## 2018-04-10 LAB — CBC
HEMATOCRIT: 39 % (ref 36.0–46.0)
HEMOGLOBIN: 13.7 g/dL (ref 12.0–15.0)
MCHC: 35.2 g/dL (ref 30.0–36.0)
MCV: 87.4 fl (ref 78.0–100.0)
PLATELETS: 244 10*3/uL (ref 150.0–400.0)
RBC: 4.47 Mil/uL (ref 3.87–5.11)
RDW: 13.8 % (ref 11.5–15.5)
WBC: 5.7 10*3/uL (ref 4.0–10.5)

## 2018-04-10 LAB — TSH: TSH: 1.52 u[IU]/mL (ref 0.35–4.50)

## 2018-04-10 LAB — FERRITIN: Ferritin: 100.2 ng/mL (ref 10.0–291.0)

## 2018-04-10 LAB — VITAMIN B12: VITAMIN B 12: 305 pg/mL (ref 211–911)

## 2018-04-10 LAB — T4, FREE: FREE T4: 0.79 ng/dL (ref 0.60–1.60)

## 2018-04-10 LAB — T3, FREE: T3, Free: 2.8 pg/mL (ref 2.3–4.2)

## 2018-04-13 LAB — VITAMIN B6: VITAMIN B6: 67.3 ng/mL — AB (ref 2.1–21.7)

## 2018-04-13 LAB — ZINC: ZINC: 71 ug/dL (ref 60–130)

## 2018-04-13 LAB — THYROID PEROXIDASE ANTIBODY

## 2018-04-13 LAB — THYROGLOBULIN ANTIBODY: Thyroglobulin Ab: <1 IU/mL (ref <=1)

## 2018-04-15 ENCOUNTER — Other Ambulatory Visit: Payer: Self-pay | Admitting: Internal Medicine

## 2018-04-15 DIAGNOSIS — Z1231 Encounter for screening mammogram for malignant neoplasm of breast: Secondary | ICD-10-CM

## 2018-04-17 DIAGNOSIS — I10 Essential (primary) hypertension: Secondary | ICD-10-CM | POA: Diagnosis not present

## 2018-04-18 DIAGNOSIS — E7800 Pure hypercholesterolemia, unspecified: Secondary | ICD-10-CM | POA: Diagnosis not present

## 2018-04-18 DIAGNOSIS — I10 Essential (primary) hypertension: Secondary | ICD-10-CM | POA: Diagnosis not present

## 2018-04-18 DIAGNOSIS — E890 Postprocedural hypothyroidism: Secondary | ICD-10-CM | POA: Diagnosis not present

## 2018-04-18 DIAGNOSIS — E785 Hyperlipidemia, unspecified: Secondary | ICD-10-CM | POA: Diagnosis not present

## 2018-05-15 DIAGNOSIS — I10 Essential (primary) hypertension: Secondary | ICD-10-CM | POA: Diagnosis not present

## 2018-05-15 DIAGNOSIS — E7800 Pure hypercholesterolemia, unspecified: Secondary | ICD-10-CM | POA: Diagnosis not present

## 2018-05-15 DIAGNOSIS — E890 Postprocedural hypothyroidism: Secondary | ICD-10-CM | POA: Diagnosis not present

## 2018-05-15 DIAGNOSIS — E785 Hyperlipidemia, unspecified: Secondary | ICD-10-CM | POA: Diagnosis not present

## 2018-06-05 ENCOUNTER — Ambulatory Visit
Admission: RE | Admit: 2018-06-05 | Discharge: 2018-06-05 | Disposition: A | Discharge status: Home / Self Care | Payer: PPO | Source: Ambulatory Visit | Attending: Internal Medicine | Admitting: Internal Medicine

## 2018-06-05 DIAGNOSIS — Z1231 Encounter for screening mammogram for malignant neoplasm of breast: Secondary | ICD-10-CM

## 2018-06-24 DIAGNOSIS — I10 Essential (primary) hypertension: Secondary | ICD-10-CM | POA: Diagnosis not present

## 2018-06-24 DIAGNOSIS — E890 Postprocedural hypothyroidism: Secondary | ICD-10-CM | POA: Diagnosis not present

## 2018-06-24 DIAGNOSIS — E7800 Pure hypercholesterolemia, unspecified: Secondary | ICD-10-CM | POA: Diagnosis not present

## 2018-06-24 DIAGNOSIS — E785 Hyperlipidemia, unspecified: Secondary | ICD-10-CM | POA: Diagnosis not present

## 2018-08-08 DIAGNOSIS — E7800 Pure hypercholesterolemia, unspecified: Secondary | ICD-10-CM | POA: Diagnosis not present

## 2018-08-08 DIAGNOSIS — E785 Hyperlipidemia, unspecified: Secondary | ICD-10-CM | POA: Diagnosis not present

## 2018-08-08 DIAGNOSIS — I10 Essential (primary) hypertension: Secondary | ICD-10-CM | POA: Diagnosis not present

## 2018-08-08 DIAGNOSIS — E890 Postprocedural hypothyroidism: Secondary | ICD-10-CM | POA: Diagnosis not present

## 2018-09-10 DIAGNOSIS — I10 Essential (primary) hypertension: Secondary | ICD-10-CM | POA: Diagnosis not present

## 2018-09-10 DIAGNOSIS — E785 Hyperlipidemia, unspecified: Secondary | ICD-10-CM | POA: Diagnosis not present

## 2018-09-10 DIAGNOSIS — E7800 Pure hypercholesterolemia, unspecified: Secondary | ICD-10-CM | POA: Diagnosis not present

## 2018-09-10 DIAGNOSIS — E890 Postprocedural hypothyroidism: Secondary | ICD-10-CM | POA: Diagnosis not present

## 2018-10-16 DIAGNOSIS — I10 Essential (primary) hypertension: Secondary | ICD-10-CM | POA: Diagnosis not present

## 2018-10-16 DIAGNOSIS — E785 Hyperlipidemia, unspecified: Secondary | ICD-10-CM | POA: Diagnosis not present

## 2018-10-16 DIAGNOSIS — E890 Postprocedural hypothyroidism: Secondary | ICD-10-CM | POA: Diagnosis not present

## 2018-10-16 DIAGNOSIS — E7800 Pure hypercholesterolemia, unspecified: Secondary | ICD-10-CM | POA: Diagnosis not present

## 2018-10-22 DIAGNOSIS — Z1389 Encounter for screening for other disorder: Secondary | ICD-10-CM | POA: Diagnosis not present

## 2018-10-22 DIAGNOSIS — I10 Essential (primary) hypertension: Secondary | ICD-10-CM | POA: Diagnosis not present

## 2018-10-22 DIAGNOSIS — Z0000 Encounter for general adult medical examination without abnormal findings: Secondary | ICD-10-CM | POA: Diagnosis not present

## 2018-11-05 DIAGNOSIS — E890 Postprocedural hypothyroidism: Secondary | ICD-10-CM | POA: Diagnosis not present

## 2018-11-05 DIAGNOSIS — E785 Hyperlipidemia, unspecified: Secondary | ICD-10-CM | POA: Diagnosis not present

## 2018-11-05 DIAGNOSIS — I10 Essential (primary) hypertension: Secondary | ICD-10-CM | POA: Diagnosis not present

## 2018-12-04 DIAGNOSIS — E785 Hyperlipidemia, unspecified: Secondary | ICD-10-CM | POA: Diagnosis not present

## 2018-12-04 DIAGNOSIS — E890 Postprocedural hypothyroidism: Secondary | ICD-10-CM | POA: Diagnosis not present

## 2018-12-04 DIAGNOSIS — I10 Essential (primary) hypertension: Secondary | ICD-10-CM | POA: Diagnosis not present

## 2019-01-14 DIAGNOSIS — E890 Postprocedural hypothyroidism: Secondary | ICD-10-CM | POA: Diagnosis not present

## 2019-01-14 DIAGNOSIS — I10 Essential (primary) hypertension: Secondary | ICD-10-CM | POA: Diagnosis not present

## 2019-01-14 DIAGNOSIS — E785 Hyperlipidemia, unspecified: Secondary | ICD-10-CM | POA: Diagnosis not present

## 2019-02-05 DIAGNOSIS — E785 Hyperlipidemia, unspecified: Secondary | ICD-10-CM | POA: Diagnosis not present

## 2019-02-05 DIAGNOSIS — I10 Essential (primary) hypertension: Secondary | ICD-10-CM | POA: Diagnosis not present

## 2019-02-05 DIAGNOSIS — E890 Postprocedural hypothyroidism: Secondary | ICD-10-CM | POA: Diagnosis not present

## 2019-03-07 DIAGNOSIS — E890 Postprocedural hypothyroidism: Secondary | ICD-10-CM | POA: Diagnosis not present

## 2019-03-07 DIAGNOSIS — E785 Hyperlipidemia, unspecified: Secondary | ICD-10-CM | POA: Diagnosis not present

## 2019-03-07 DIAGNOSIS — I10 Essential (primary) hypertension: Secondary | ICD-10-CM | POA: Diagnosis not present

## 2019-04-10 DIAGNOSIS — I10 Essential (primary) hypertension: Secondary | ICD-10-CM | POA: Diagnosis not present

## 2019-04-23 DIAGNOSIS — E890 Postprocedural hypothyroidism: Secondary | ICD-10-CM | POA: Diagnosis not present

## 2019-04-23 DIAGNOSIS — E785 Hyperlipidemia, unspecified: Secondary | ICD-10-CM | POA: Diagnosis not present

## 2019-04-23 DIAGNOSIS — I10 Essential (primary) hypertension: Secondary | ICD-10-CM | POA: Diagnosis not present

## 2019-04-29 ENCOUNTER — Other Ambulatory Visit: Payer: Self-pay | Admitting: Internal Medicine

## 2019-04-29 DIAGNOSIS — Z1231 Encounter for screening mammogram for malignant neoplasm of breast: Secondary | ICD-10-CM

## 2019-05-26 DIAGNOSIS — E785 Hyperlipidemia, unspecified: Secondary | ICD-10-CM | POA: Diagnosis not present

## 2019-05-26 DIAGNOSIS — I10 Essential (primary) hypertension: Secondary | ICD-10-CM | POA: Diagnosis not present

## 2019-05-26 DIAGNOSIS — E890 Postprocedural hypothyroidism: Secondary | ICD-10-CM | POA: Diagnosis not present

## 2019-06-05 DIAGNOSIS — N343 Urethral syndrome, unspecified: Secondary | ICD-10-CM | POA: Diagnosis not present

## 2019-06-25 ENCOUNTER — Ambulatory Visit
Admission: RE | Admit: 2019-06-25 | Discharge: 2019-06-25 | Disposition: A | Discharge status: Home / Self Care | Payer: PPO | Source: Ambulatory Visit | Attending: Internal Medicine | Admitting: Internal Medicine

## 2019-06-25 ENCOUNTER — Other Ambulatory Visit: Payer: Self-pay

## 2019-06-25 DIAGNOSIS — Z1231 Encounter for screening mammogram for malignant neoplasm of breast: Secondary | ICD-10-CM | POA: Diagnosis not present

## 2019-07-25 DIAGNOSIS — E785 Hyperlipidemia, unspecified: Secondary | ICD-10-CM | POA: Diagnosis not present

## 2019-07-25 DIAGNOSIS — I10 Essential (primary) hypertension: Secondary | ICD-10-CM | POA: Diagnosis not present

## 2019-07-25 DIAGNOSIS — E890 Postprocedural hypothyroidism: Secondary | ICD-10-CM | POA: Diagnosis not present

## 2019-10-08 DIAGNOSIS — E785 Hyperlipidemia, unspecified: Secondary | ICD-10-CM | POA: Diagnosis not present

## 2019-10-08 DIAGNOSIS — E890 Postprocedural hypothyroidism: Secondary | ICD-10-CM | POA: Diagnosis not present

## 2019-10-08 DIAGNOSIS — I10 Essential (primary) hypertension: Secondary | ICD-10-CM | POA: Diagnosis not present

## 2019-10-23 DIAGNOSIS — M85852 Other specified disorders of bone density and structure, left thigh: Secondary | ICD-10-CM | POA: Diagnosis not present

## 2019-10-23 DIAGNOSIS — R197 Diarrhea, unspecified: Secondary | ICD-10-CM | POA: Diagnosis not present

## 2019-10-23 DIAGNOSIS — G4733 Obstructive sleep apnea (adult) (pediatric): Secondary | ICD-10-CM | POA: Diagnosis not present

## 2019-10-23 DIAGNOSIS — I10 Essential (primary) hypertension: Secondary | ICD-10-CM | POA: Diagnosis not present

## 2019-10-23 DIAGNOSIS — Z0000 Encounter for general adult medical examination without abnormal findings: Secondary | ICD-10-CM | POA: Diagnosis not present

## 2019-10-23 DIAGNOSIS — Z1389 Encounter for screening for other disorder: Secondary | ICD-10-CM | POA: Diagnosis not present

## 2019-12-09 DIAGNOSIS — M25562 Pain in left knee: Secondary | ICD-10-CM | POA: Diagnosis not present

## 2019-12-09 DIAGNOSIS — M7052 Other bursitis of knee, left knee: Secondary | ICD-10-CM | POA: Diagnosis not present

## 2019-12-09 DIAGNOSIS — M7051 Other bursitis of knee, right knee: Secondary | ICD-10-CM | POA: Diagnosis not present

## 2019-12-09 DIAGNOSIS — M1712 Unilateral primary osteoarthritis, left knee: Secondary | ICD-10-CM | POA: Diagnosis not present

## 2019-12-22 DIAGNOSIS — M25561 Pain in right knee: Secondary | ICD-10-CM | POA: Diagnosis not present

## 2019-12-22 DIAGNOSIS — M25562 Pain in left knee: Secondary | ICD-10-CM | POA: Diagnosis not present

## 2019-12-29 DIAGNOSIS — M25562 Pain in left knee: Secondary | ICD-10-CM | POA: Diagnosis not present

## 2019-12-29 DIAGNOSIS — M25561 Pain in right knee: Secondary | ICD-10-CM | POA: Diagnosis not present

## 2020-01-01 DIAGNOSIS — M25562 Pain in left knee: Secondary | ICD-10-CM | POA: Diagnosis not present

## 2020-01-01 DIAGNOSIS — M25561 Pain in right knee: Secondary | ICD-10-CM | POA: Diagnosis not present

## 2020-01-05 DIAGNOSIS — M25561 Pain in right knee: Secondary | ICD-10-CM | POA: Diagnosis not present

## 2020-01-05 DIAGNOSIS — M25562 Pain in left knee: Secondary | ICD-10-CM | POA: Diagnosis not present

## 2020-01-08 DIAGNOSIS — M25561 Pain in right knee: Secondary | ICD-10-CM | POA: Diagnosis not present

## 2020-01-08 DIAGNOSIS — M25562 Pain in left knee: Secondary | ICD-10-CM | POA: Diagnosis not present

## 2020-01-12 DIAGNOSIS — M25561 Pain in right knee: Secondary | ICD-10-CM | POA: Diagnosis not present

## 2020-01-12 DIAGNOSIS — M25562 Pain in left knee: Secondary | ICD-10-CM | POA: Diagnosis not present

## 2020-01-14 DIAGNOSIS — I10 Essential (primary) hypertension: Secondary | ICD-10-CM | POA: Diagnosis not present

## 2020-01-14 DIAGNOSIS — E785 Hyperlipidemia, unspecified: Secondary | ICD-10-CM | POA: Diagnosis not present

## 2020-01-14 DIAGNOSIS — E890 Postprocedural hypothyroidism: Secondary | ICD-10-CM | POA: Diagnosis not present

## 2020-01-15 DIAGNOSIS — M25562 Pain in left knee: Secondary | ICD-10-CM | POA: Diagnosis not present

## 2020-01-15 DIAGNOSIS — M25561 Pain in right knee: Secondary | ICD-10-CM | POA: Diagnosis not present

## 2020-01-19 DIAGNOSIS — M25562 Pain in left knee: Secondary | ICD-10-CM | POA: Diagnosis not present

## 2020-01-19 DIAGNOSIS — M25561 Pain in right knee: Secondary | ICD-10-CM | POA: Diagnosis not present

## 2020-01-22 DIAGNOSIS — M25562 Pain in left knee: Secondary | ICD-10-CM | POA: Diagnosis not present

## 2020-01-22 DIAGNOSIS — M25561 Pain in right knee: Secondary | ICD-10-CM | POA: Diagnosis not present

## 2020-01-26 DIAGNOSIS — M7052 Other bursitis of knee, left knee: Secondary | ICD-10-CM | POA: Diagnosis not present

## 2020-01-26 DIAGNOSIS — M25562 Pain in left knee: Secondary | ICD-10-CM | POA: Diagnosis not present

## 2020-03-05 DIAGNOSIS — M1712 Unilateral primary osteoarthritis, left knee: Secondary | ICD-10-CM | POA: Diagnosis not present

## 2020-03-05 DIAGNOSIS — M7052 Other bursitis of knee, left knee: Secondary | ICD-10-CM | POA: Diagnosis not present

## 2020-03-25 DIAGNOSIS — M25562 Pain in left knee: Secondary | ICD-10-CM | POA: Diagnosis not present

## 2020-04-05 DIAGNOSIS — M7052 Other bursitis of knee, left knee: Secondary | ICD-10-CM | POA: Diagnosis not present

## 2020-04-05 DIAGNOSIS — M1712 Unilateral primary osteoarthritis, left knee: Secondary | ICD-10-CM | POA: Diagnosis not present

## 2020-04-12 DIAGNOSIS — M25562 Pain in left knee: Secondary | ICD-10-CM | POA: Diagnosis not present

## 2020-04-12 DIAGNOSIS — S83242S Other tear of medial meniscus, current injury, left knee, sequela: Secondary | ICD-10-CM | POA: Diagnosis not present

## 2020-04-28 DIAGNOSIS — I10 Essential (primary) hypertension: Secondary | ICD-10-CM | POA: Diagnosis not present

## 2020-04-28 DIAGNOSIS — M25462 Effusion, left knee: Secondary | ICD-10-CM | POA: Diagnosis not present

## 2020-05-14 ENCOUNTER — Other Ambulatory Visit: Payer: Self-pay | Admitting: Internal Medicine

## 2020-05-14 DIAGNOSIS — Z1231 Encounter for screening mammogram for malignant neoplasm of breast: Secondary | ICD-10-CM

## 2020-05-14 DIAGNOSIS — M8580 Other specified disorders of bone density and structure, unspecified site: Secondary | ICD-10-CM

## 2020-05-17 DIAGNOSIS — G8918 Other acute postprocedural pain: Secondary | ICD-10-CM | POA: Diagnosis not present

## 2020-05-17 DIAGNOSIS — Y999 Unspecified external cause status: Secondary | ICD-10-CM | POA: Diagnosis not present

## 2020-05-17 DIAGNOSIS — S83232A Complex tear of medial meniscus, current injury, left knee, initial encounter: Secondary | ICD-10-CM | POA: Diagnosis not present

## 2020-05-17 DIAGNOSIS — X58XXXA Exposure to other specified factors, initial encounter: Secondary | ICD-10-CM | POA: Diagnosis not present

## 2020-05-17 DIAGNOSIS — M659 Synovitis and tenosynovitis, unspecified: Secondary | ICD-10-CM | POA: Diagnosis not present

## 2020-05-17 DIAGNOSIS — S83272A Complex tear of lateral meniscus, current injury, left knee, initial encounter: Secondary | ICD-10-CM | POA: Diagnosis not present

## 2020-05-17 DIAGNOSIS — M94262 Chondromalacia, left knee: Secondary | ICD-10-CM | POA: Diagnosis not present

## 2020-05-17 DIAGNOSIS — M6752 Plica syndrome, left knee: Secondary | ICD-10-CM | POA: Diagnosis not present

## 2020-06-10 DIAGNOSIS — M25562 Pain in left knee: Secondary | ICD-10-CM | POA: Diagnosis not present

## 2020-06-17 DIAGNOSIS — M25562 Pain in left knee: Secondary | ICD-10-CM | POA: Diagnosis not present

## 2020-06-22 DIAGNOSIS — M25562 Pain in left knee: Secondary | ICD-10-CM | POA: Diagnosis not present

## 2020-06-24 DIAGNOSIS — M25562 Pain in left knee: Secondary | ICD-10-CM | POA: Diagnosis not present

## 2020-06-28 DIAGNOSIS — M25562 Pain in left knee: Secondary | ICD-10-CM | POA: Diagnosis not present

## 2020-06-29 ENCOUNTER — Ambulatory Visit: Payer: PPO

## 2020-06-30 DIAGNOSIS — M25562 Pain in left knee: Secondary | ICD-10-CM | POA: Diagnosis not present

## 2020-07-06 DIAGNOSIS — M25562 Pain in left knee: Secondary | ICD-10-CM | POA: Diagnosis not present

## 2020-07-08 DIAGNOSIS — M25562 Pain in left knee: Secondary | ICD-10-CM | POA: Diagnosis not present

## 2020-08-23 ENCOUNTER — Inpatient Hospital Stay: Admission: RE | Admit: 2020-08-23 | Payer: PPO | Source: Ambulatory Visit

## 2020-08-23 ENCOUNTER — Ambulatory Visit: Payer: PPO

## 2020-08-23 ENCOUNTER — Other Ambulatory Visit: Payer: PPO

## 2020-09-14 ENCOUNTER — Ambulatory Visit: Payer: PPO

## 2020-09-14 ENCOUNTER — Other Ambulatory Visit: Payer: Self-pay

## 2020-09-14 ENCOUNTER — Ambulatory Visit
Admission: RE | Admit: 2020-09-14 | Discharge: 2020-09-14 | Disposition: A | Discharge status: Home / Self Care | Payer: PPO | Source: Ambulatory Visit | Attending: Internal Medicine | Admitting: Internal Medicine

## 2020-09-14 DIAGNOSIS — Z1231 Encounter for screening mammogram for malignant neoplasm of breast: Secondary | ICD-10-CM

## 2020-09-14 DIAGNOSIS — M8580 Other specified disorders of bone density and structure, unspecified site: Secondary | ICD-10-CM

## 2020-09-14 DIAGNOSIS — Z780 Asymptomatic menopausal state: Secondary | ICD-10-CM | POA: Diagnosis not present

## 2020-10-28 DIAGNOSIS — Z136 Encounter for screening for cardiovascular disorders: Secondary | ICD-10-CM | POA: Diagnosis not present

## 2020-10-28 DIAGNOSIS — I10 Essential (primary) hypertension: Secondary | ICD-10-CM | POA: Diagnosis not present

## 2020-10-28 DIAGNOSIS — Z0000 Encounter for general adult medical examination without abnormal findings: Secondary | ICD-10-CM | POA: Diagnosis not present

## 2020-10-28 DIAGNOSIS — Z1389 Encounter for screening for other disorder: Secondary | ICD-10-CM | POA: Diagnosis not present

## 2020-10-28 DIAGNOSIS — R197 Diarrhea, unspecified: Secondary | ICD-10-CM | POA: Diagnosis not present

## 2020-10-28 DIAGNOSIS — M79661 Pain in right lower leg: Secondary | ICD-10-CM | POA: Diagnosis not present

## 2020-10-29 ENCOUNTER — Other Ambulatory Visit: Payer: Self-pay

## 2020-10-29 ENCOUNTER — Other Ambulatory Visit: Payer: Self-pay | Admitting: Internal Medicine

## 2020-10-29 ENCOUNTER — Ambulatory Visit
Admission: RE | Admit: 2020-10-29 | Discharge: 2020-10-29 | Disposition: A | Discharge status: Home / Self Care | Payer: PPO | Source: Ambulatory Visit | Attending: Internal Medicine | Admitting: Internal Medicine

## 2020-10-29 DIAGNOSIS — M79661 Pain in right lower leg: Secondary | ICD-10-CM

## 2021-01-04 IMAGING — MG DIGITAL SCREENING BILAT W/ TOMO W/ CAD
8 series · 8 of 24 positions shown · non-contrast
Comparison: Previous exam(s).

CLINICAL DATA: Screening.

EXAM:
DIGITAL SCREENING BILATERAL MAMMOGRAM WITH TOMO AND CAD

[L MLO synth-2D]
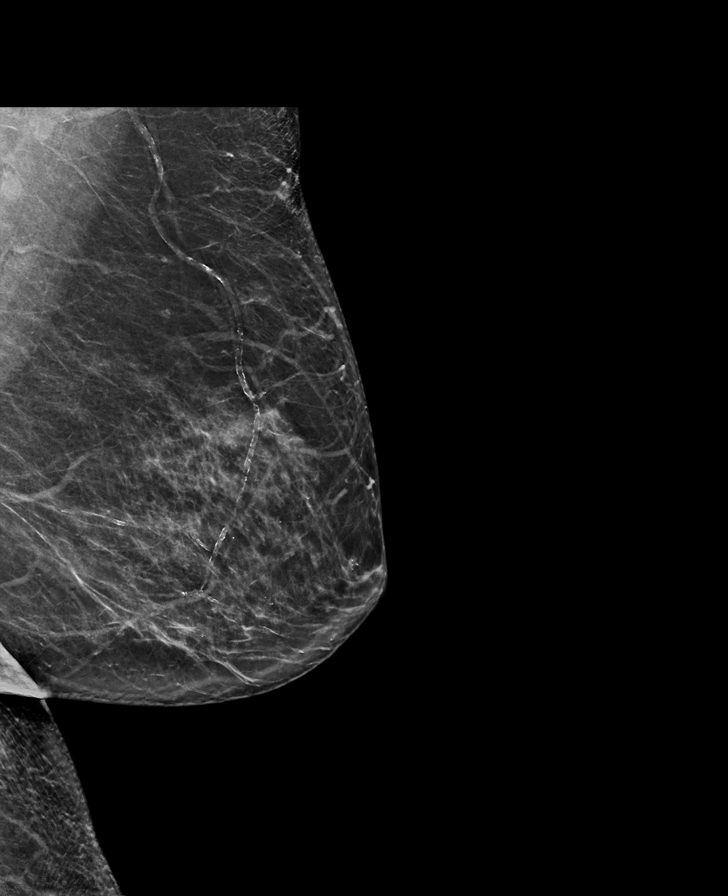

[R CC synth-2D]
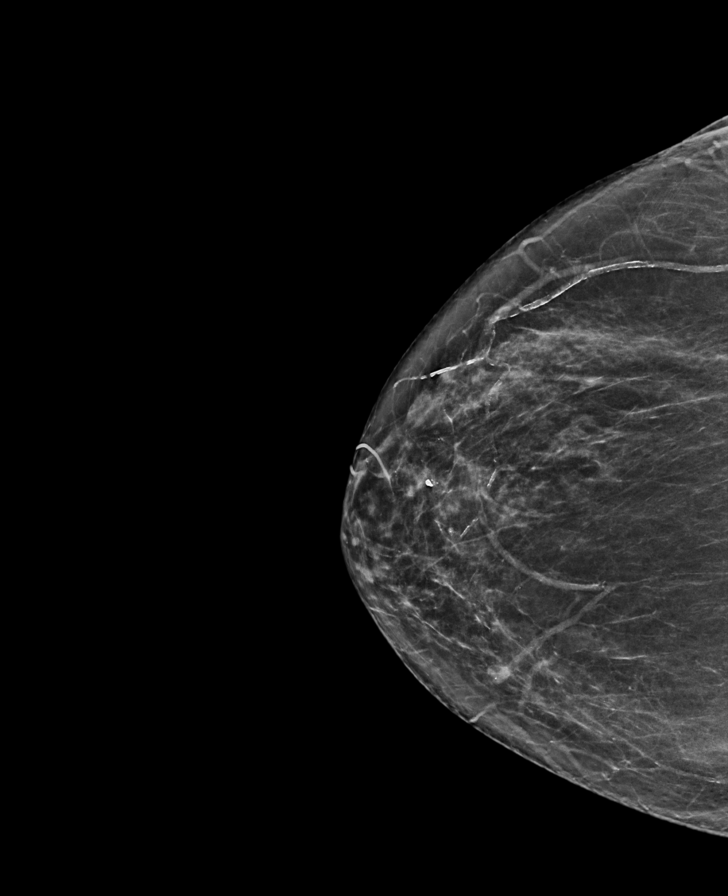

[R MLO synth-2D]
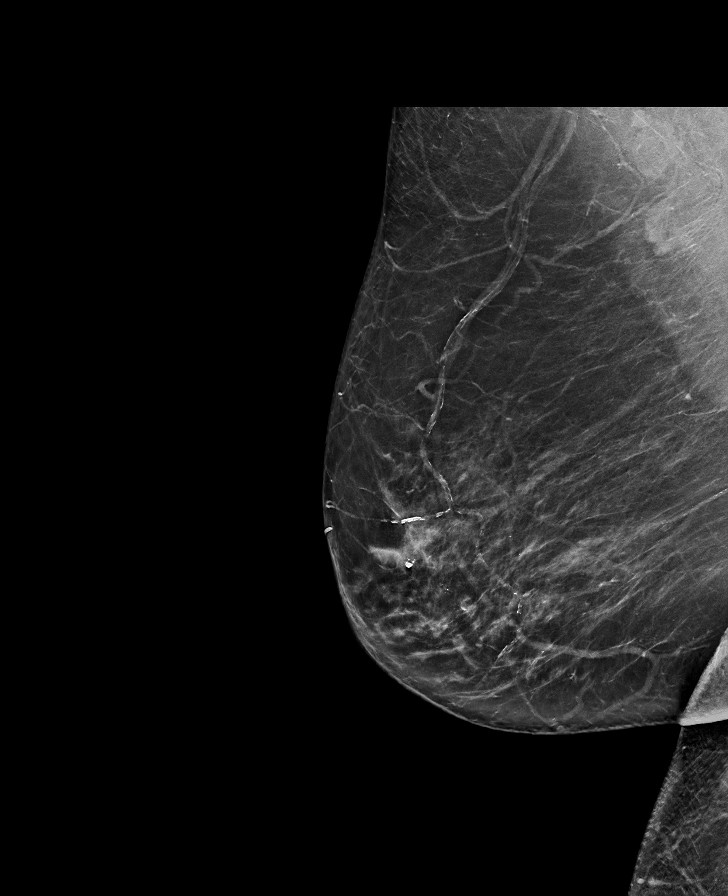

[L CC synth-2D]
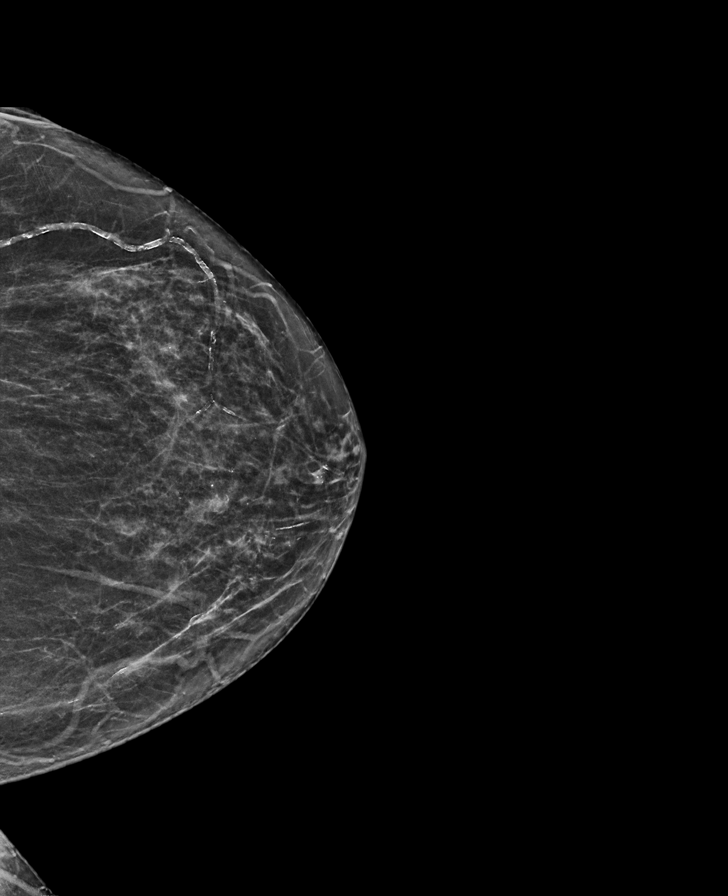

[R MLO tomo · tomo slice 39/76.0]
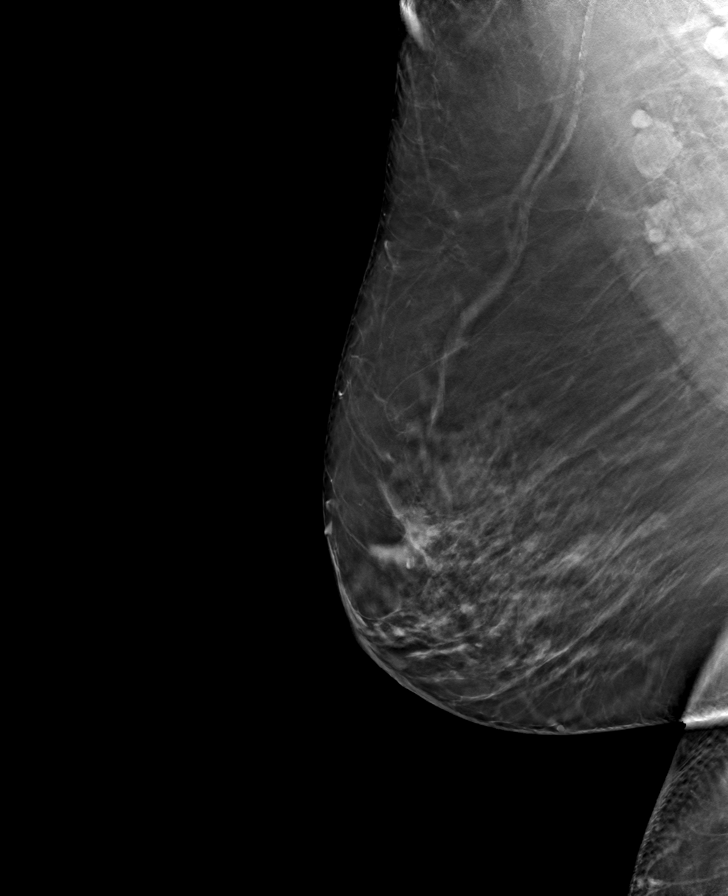

[L CC tomo · tomo slice 30/59.0]
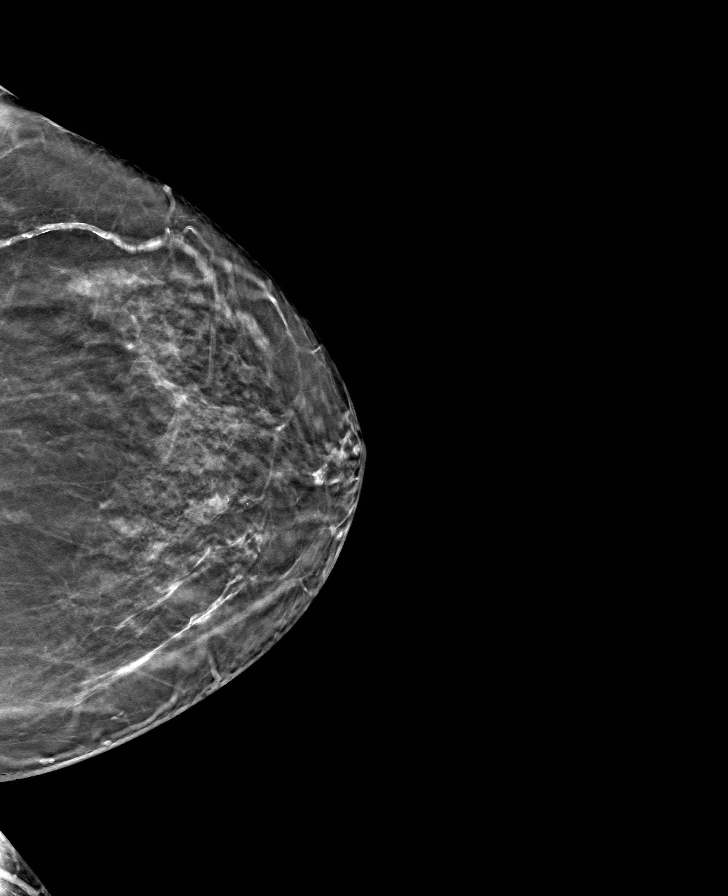

[R CC tomo · tomo slice 33/66.0]
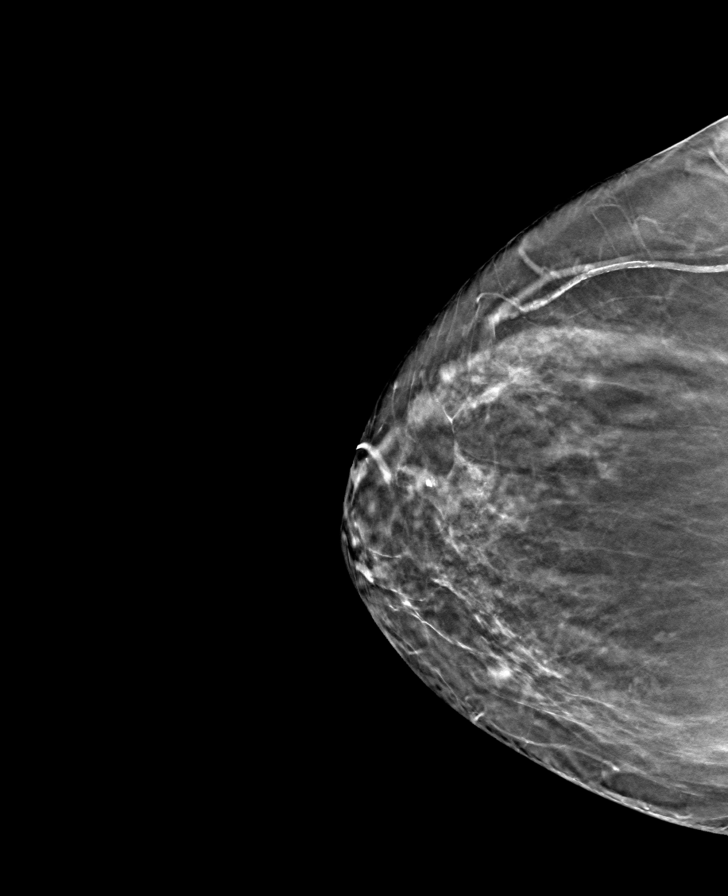

[L MLO tomo · tomo slice 37/72.0]
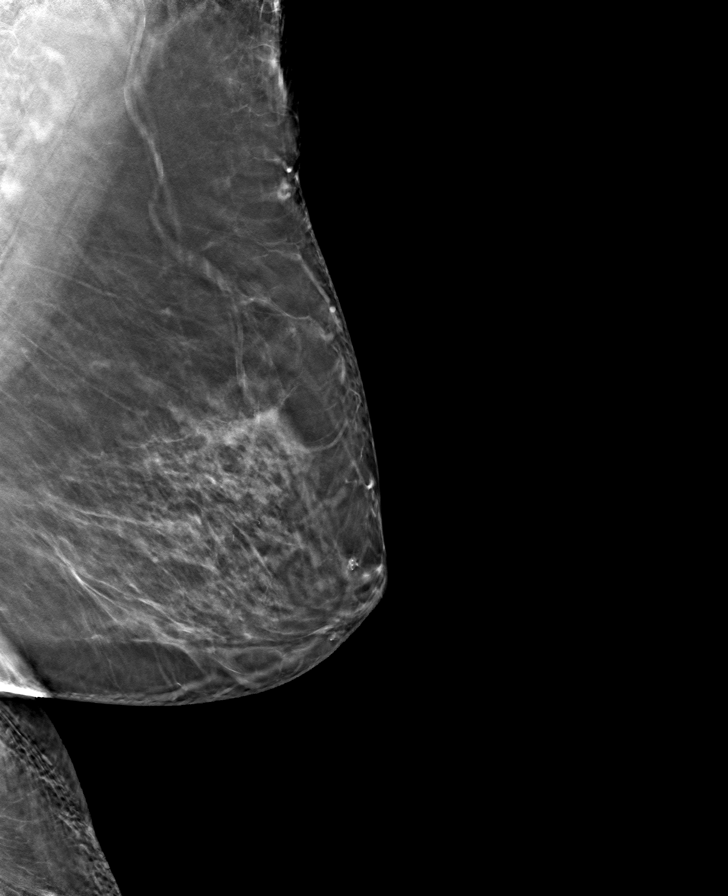

[8 of 24 positions shown; findings below may reference images not displayed]

ACR Breast Density Category b: There are scattered areas of
fibroglandular density.
FINDINGS: There are no findings suspicious for malignancy. Images were
processed with CAD.
IMPRESSION: No mammographic evidence of malignancy. A result letter of this
screening mammogram will be mailed directly to the patient.

RECOMMENDATION:
Screening mammogram in one year. (Code:[id number])

BI-RADS CATEGORY  1: Negative.

## 2021-02-10 DIAGNOSIS — U071 COVID-19: Secondary | ICD-10-CM | POA: Diagnosis not present

## 2021-03-03 DIAGNOSIS — U099 Post covid-19 condition, unspecified: Secondary | ICD-10-CM | POA: Diagnosis not present

## 2021-05-04 DIAGNOSIS — G4733 Obstructive sleep apnea (adult) (pediatric): Secondary | ICD-10-CM | POA: Diagnosis not present

## 2021-05-04 DIAGNOSIS — I10 Essential (primary) hypertension: Secondary | ICD-10-CM | POA: Diagnosis not present

## 2021-05-04 DIAGNOSIS — M545 Low back pain, unspecified: Secondary | ICD-10-CM | POA: Diagnosis not present

## 2021-05-04 DIAGNOSIS — R197 Diarrhea, unspecified: Secondary | ICD-10-CM | POA: Diagnosis not present

## 2021-07-26 ENCOUNTER — Other Ambulatory Visit: Payer: Self-pay | Admitting: Internal Medicine

## 2021-07-26 DIAGNOSIS — Z1231 Encounter for screening mammogram for malignant neoplasm of breast: Secondary | ICD-10-CM

## 2021-09-15 ENCOUNTER — Ambulatory Visit
Admission: RE | Admit: 2021-09-15 | Discharge: 2021-09-15 | Disposition: A | Discharge status: Home / Self Care | Payer: PPO | Source: Ambulatory Visit | Attending: Internal Medicine | Admitting: Internal Medicine

## 2021-09-15 DIAGNOSIS — Z1231 Encounter for screening mammogram for malignant neoplasm of breast: Secondary | ICD-10-CM

## 2021-09-16 ENCOUNTER — Other Ambulatory Visit: Payer: Self-pay | Admitting: Internal Medicine

## 2021-09-16 DIAGNOSIS — R928 Other abnormal and inconclusive findings on diagnostic imaging of breast: Secondary | ICD-10-CM

## 2021-09-21 ENCOUNTER — Other Ambulatory Visit: Payer: PPO

## 2021-09-22 ENCOUNTER — Ambulatory Visit: Payer: PPO

## 2021-09-22 ENCOUNTER — Ambulatory Visit
Admission: RE | Admit: 2021-09-22 | Discharge: 2021-09-22 | Disposition: A | Discharge status: Home / Self Care | Payer: PPO | Source: Ambulatory Visit | Attending: Internal Medicine | Admitting: Internal Medicine

## 2021-09-22 DIAGNOSIS — R928 Other abnormal and inconclusive findings on diagnostic imaging of breast: Secondary | ICD-10-CM

## 2021-09-30 ENCOUNTER — Other Ambulatory Visit: Payer: PPO

## 2021-11-02 DIAGNOSIS — Z1389 Encounter for screening for other disorder: Secondary | ICD-10-CM | POA: Diagnosis not present

## 2021-11-02 DIAGNOSIS — R197 Diarrhea, unspecified: Secondary | ICD-10-CM | POA: Diagnosis not present

## 2021-11-02 DIAGNOSIS — Z0000 Encounter for general adult medical examination without abnormal findings: Secondary | ICD-10-CM | POA: Diagnosis not present

## 2021-11-02 DIAGNOSIS — I10 Essential (primary) hypertension: Secondary | ICD-10-CM | POA: Diagnosis not present

## 2021-11-11 DIAGNOSIS — I10 Essential (primary) hypertension: Secondary | ICD-10-CM | POA: Diagnosis not present

## 2022-05-11 DIAGNOSIS — E890 Postprocedural hypothyroidism: Secondary | ICD-10-CM | POA: Diagnosis not present

## 2022-05-11 DIAGNOSIS — K529 Noninfective gastroenteritis and colitis, unspecified: Secondary | ICD-10-CM | POA: Diagnosis not present

## 2022-05-11 DIAGNOSIS — R252 Cramp and spasm: Secondary | ICD-10-CM | POA: Diagnosis not present

## 2022-05-11 DIAGNOSIS — I10 Essential (primary) hypertension: Secondary | ICD-10-CM | POA: Diagnosis not present

## 2022-06-05 DIAGNOSIS — L570 Actinic keratosis: Secondary | ICD-10-CM | POA: Diagnosis not present

## 2022-06-23 DIAGNOSIS — M65862 Other synovitis and tenosynovitis, left lower leg: Secondary | ICD-10-CM | POA: Diagnosis not present

## 2022-06-23 DIAGNOSIS — M79672 Pain in left foot: Secondary | ICD-10-CM | POA: Diagnosis not present

## 2022-06-29 ENCOUNTER — Other Ambulatory Visit: Payer: Self-pay | Admitting: Internal Medicine

## 2022-06-29 DIAGNOSIS — Z1231 Encounter for screening mammogram for malignant neoplasm of breast: Secondary | ICD-10-CM

## 2022-09-26 ENCOUNTER — Ambulatory Visit
Admission: RE | Admit: 2022-09-26 | Discharge: 2022-09-26 | Disposition: A | Discharge status: Home / Self Care | Payer: PPO | Source: Ambulatory Visit | Attending: Internal Medicine | Admitting: Internal Medicine

## 2022-09-26 DIAGNOSIS — Z1231 Encounter for screening mammogram for malignant neoplasm of breast: Secondary | ICD-10-CM

## 2022-12-04 ENCOUNTER — Ambulatory Visit: Admission: RE | Admit: 2022-12-04 | Payer: PPO | Source: Ambulatory Visit

## 2022-12-04 ENCOUNTER — Other Ambulatory Visit: Payer: Self-pay | Admitting: Internal Medicine

## 2022-12-04 DIAGNOSIS — J069 Acute upper respiratory infection, unspecified: Secondary | ICD-10-CM

## 2022-12-06 ENCOUNTER — Encounter (HOSPITAL_COMMUNITY): Payer: Self-pay

## 2022-12-06 ENCOUNTER — Emergency Department (HOSPITAL_COMMUNITY): Payer: PPO

## 2022-12-06 ENCOUNTER — Other Ambulatory Visit: Payer: Self-pay

## 2022-12-06 ENCOUNTER — Inpatient Hospital Stay (HOSPITAL_COMMUNITY)
Admission: EM | Admit: 2022-12-06 | Discharge: 2022-12-09 | DRG: 547 | Disposition: A | Discharge status: Home / Self Care | Payer: PPO | Attending: Internal Medicine | Admitting: Internal Medicine

## 2022-12-06 ENCOUNTER — Ambulatory Visit
Admission: EM | Admit: 2022-12-06 | Discharge: 2022-12-06 | Disposition: A | Discharge status: Home / Self Care | Payer: PPO

## 2022-12-06 DIAGNOSIS — H539 Unspecified visual disturbance: Secondary | ICD-10-CM

## 2022-12-06 DIAGNOSIS — E785 Hyperlipidemia, unspecified: Secondary | ICD-10-CM | POA: Diagnosis present

## 2022-12-06 DIAGNOSIS — J40 Bronchitis, not specified as acute or chronic: Secondary | ICD-10-CM | POA: Diagnosis present

## 2022-12-06 DIAGNOSIS — Z6830 Body mass index (BMI) 30.0-30.9, adult: Secondary | ICD-10-CM

## 2022-12-06 DIAGNOSIS — H5462 Unqualified visual loss, left eye, normal vision right eye: Secondary | ICD-10-CM | POA: Insufficient documentation

## 2022-12-06 DIAGNOSIS — D649 Anemia, unspecified: Secondary | ICD-10-CM | POA: Diagnosis present

## 2022-12-06 DIAGNOSIS — Z887 Allergy status to serum and vaccine status: Secondary | ICD-10-CM

## 2022-12-06 DIAGNOSIS — R6884 Jaw pain: Secondary | ICD-10-CM | POA: Insufficient documentation

## 2022-12-06 DIAGNOSIS — E876 Hypokalemia: Secondary | ICD-10-CM | POA: Diagnosis present

## 2022-12-06 DIAGNOSIS — I10 Essential (primary) hypertension: Secondary | ICD-10-CM | POA: Diagnosis present

## 2022-12-06 DIAGNOSIS — Z79899 Other long term (current) drug therapy: Secondary | ICD-10-CM

## 2022-12-06 DIAGNOSIS — M316 Other giant cell arteritis: Principal | ICD-10-CM | POA: Diagnosis present

## 2022-12-06 DIAGNOSIS — Z7982 Long term (current) use of aspirin: Secondary | ICD-10-CM

## 2022-12-06 DIAGNOSIS — Z91018 Allergy to other foods: Secondary | ICD-10-CM

## 2022-12-06 DIAGNOSIS — R42 Dizziness and giddiness: Secondary | ICD-10-CM | POA: Insufficient documentation

## 2022-12-06 DIAGNOSIS — E669 Obesity, unspecified: Secondary | ICD-10-CM | POA: Diagnosis present

## 2022-12-06 LAB — CBC
HCT: 34.4 % — ABNORMAL LOW (ref 36.0–46.0)
Hemoglobin: 11.3 g/dL — ABNORMAL LOW (ref 12.0–15.0)
MCH: 29 pg (ref 26.0–34.0)
MCHC: 32.8 g/dL (ref 30.0–36.0)
MCV: 88.2 fL (ref 80.0–100.0)
Platelets: 374 10*3/uL (ref 150–400)
RBC: 3.9 MIL/uL (ref 3.87–5.11)
RDW: 13.3 % (ref 11.5–15.5)
WBC: 10.4 10*3/uL (ref 4.0–10.5)
nRBC: 0 % (ref 0.0–0.2)

## 2022-12-06 LAB — COMPREHENSIVE METABOLIC PANEL
ALT: 50 U/L — ABNORMAL HIGH (ref 0–44)
AST: 35 U/L (ref 15–41)
Albumin: 2.7 g/dL — ABNORMAL LOW (ref 3.5–5.0)
Alkaline Phosphatase: 80 U/L (ref 38–126)
Anion gap: 13 (ref 5–15)
BUN: 16 mg/dL (ref 8–23)
CO2: 26 mmol/L (ref 22–32)
Calcium: 9.3 mg/dL (ref 8.9–10.3)
Chloride: 99 mmol/L (ref 98–111)
Creatinine, Ser: 0.96 mg/dL (ref 0.44–1.00)
GFR, Estimated: 59 mL/min — ABNORMAL LOW (ref >60)
Glucose, Bld: 100 mg/dL — ABNORMAL HIGH (ref 70–99)
Potassium: 3.2 mmol/L — ABNORMAL LOW (ref 3.5–5.1)
Sodium: 138 mmol/L (ref 135–145)
Total Bilirubin: 0.7 mg/dL (ref 0.3–1.2)
Total Protein: 6.8 g/dL (ref 6.5–8.1)

## 2022-12-06 LAB — DIFFERENTIAL
Abs Immature Granulocytes: 0.06 10*3/uL (ref 0.00–0.07)
Basophils Absolute: 0.1 10*3/uL (ref 0.0–0.1)
Basophils Relative: 1 %
Eosinophils Absolute: 0.1 10*3/uL (ref 0.0–0.5)
Eosinophils Relative: 1 %
Immature Granulocytes: 1 %
Lymphocytes Relative: 13 %
Lymphs Abs: 1.4 10*3/uL (ref 0.7–4.0)
Monocytes Absolute: 0.9 10*3/uL (ref 0.1–1.0)
Monocytes Relative: 9 %
Neutro Abs: 7.9 10*3/uL — ABNORMAL HIGH (ref 1.7–7.7)
Neutrophils Relative %: 75 %

## 2022-12-06 LAB — PROTIME-INR
INR: 1.3 — ABNORMAL HIGH (ref 0.8–1.2)
Prothrombin Time: 16 seconds — ABNORMAL HIGH (ref 11.4–15.2)

## 2022-12-06 LAB — C-REACTIVE PROTEIN: CRP: 14.1 mg/dL — ABNORMAL HIGH (ref <1.0)

## 2022-12-06 LAB — APTT: aPTT: 35 seconds (ref 24–36)

## 2022-12-06 LAB — I-STAT CHEM 8, ED
BUN: 16 mg/dL (ref 8–23)
Calcium, Ion: 1.16 mmol/L (ref 1.15–1.40)
Chloride: 100 mmol/L (ref 98–111)
Creatinine, Ser: 0.9 mg/dL (ref 0.44–1.00)
Glucose, Bld: 101 mg/dL — ABNORMAL HIGH (ref 70–99)
HCT: 34 % — ABNORMAL LOW (ref 36.0–46.0)
Hemoglobin: 11.6 g/dL — ABNORMAL LOW (ref 12.0–15.0)
Potassium: 3.2 mmol/L — ABNORMAL LOW (ref 3.5–5.1)
Sodium: 138 mmol/L (ref 135–145)
TCO2: 27 mmol/L (ref 22–32)

## 2022-12-06 LAB — CBG MONITORING, ED: Glucose-Capillary: 88 mg/dL (ref 70–99)

## 2022-12-06 LAB — SEDIMENTATION RATE: Sed Rate: 114 mm/hr — ABNORMAL HIGH (ref 0–22)

## 2022-12-06 LAB — ETHANOL: Alcohol, Ethyl (B): <10 mg/dL (ref <10)

## 2022-12-06 MED ORDER — SODIUM CHLORIDE 0.9 % IV SOLN
1000.0000 mg | Freq: Once | INTRAVENOUS | Status: AC
  Administered 2022-12-07: 1000 mg via INTRAVENOUS
  Filled 2022-12-06: qty 16

## 2022-12-06 MED ORDER — SODIUM CHLORIDE 0.9% FLUSH: 3.0000 mL | Freq: Once | INTRAVENOUS | Status: DC

## 2022-12-06 MED ORDER — IOHEXOL 350 MG/ML SOLN
75.0000 mL | Freq: Once | INTRAVENOUS | Status: AC | PRN
  Administered 2022-12-06: 75 mL via INTRAVENOUS

## 2022-12-06 NOTE — H&P (Incomplete)
History and Physical  Sara Boyer:[id number] DOB: [birth year] DOA: 12/06/2022  Referring physician: Dr. Clayborne Dana, EDP  PCP: Pcp, No  Outpatient Specialists: None Patient coming from: Home  Chief Complaint: Intermittent vision changes since 8 AM   HPI: Sara Boyer is a 82 y.o. female with medical history significant for hypertension, taken off her oral antihypertensives (hydrochlorothiazide) due to hypotension and lightheadedness associated with it, who presents with complaints of acute visual changes affecting her left eye.  States this morning around 8 AM she noticed her left eye had complete vision loss.  She could not see anything out of her left eye.  This resolved spontaneously, however intermittently she started to have color vision changes, colors from her left eyes are only black-and-white.  Symptoms have been associated with pain in her scalp when she combs her hair.  Pain in her shoulders which led to a visit with orthopedic surgery.  Also reports pain in her temporal region bilaterally.  Due to a constellation of symptoms, she presented to the ED for further evaluation.  Reportedly, the patient had similar symptoms multiple times since a few days ago.  In the ED, sed rate elevated 114 and CRP elevated 14.1.  Due to a high suspicion for giant cell arteritis, EDP discussed the case with neurology who recommended high-dose steroids 1 g IV Solu-Medrol, brain MRI to rule out an ophthalmic TIA, and admission for further workup.  The patient was admitted by [facility], hospitalist service, to telemetry medical unit as observation status.  At the time of this visit, the patient's visual condition was back to her baseline, however she continues to have tenderness in her temporal region bilaterally with minimal palpation.  ED Course: Temperature 98.4.  BP 126/65, pulse 74, respiration rate 11, O2 saturation 99% on room air.  Lab studies markable for sed rate 114, CRP 14.1.  Serum  potassium 3.2, glucose 101, creatinine 0.96 with GFR 59.  ALT 50.  Hemoglobin 11.6.  Review of Systems: Review of systems as noted in the HPI. All other systems reviewed and are negative.   Past Medical History:  Diagnosis Date   Anemia    hx of as a child    Hypertension    Pneumonia    hx of    Shortness of breath    with exertion   Past Surgical History:  Procedure Laterality Date   ABDOMINAL HYSTERECTOMY     APPENDECTOMY     BREAST CYST EXCISION Right    BREAST SURGERY     right breast surgery for benign lump    corrective surgery to open fallopian tubes      DILATION AND CURETTAGE OF UTERUS     several    HEMORRHOID SURGERY     lobe removed from thyroid      SHOULDER OPEN ROTATOR CUFF REPAIR  01/24/2012   Procedure: ROTATOR CUFF REPAIR SHOULDER OPEN;  Surgeon: Jacki Cones, MD;  Location: [facility];  Service: Orthopedics;  Laterality: Left;  Left Shoulder Rotator Cuff Repair with Graft and Anchors and acrominectomy   TONSILLECTOMY      Social History:  reports that she has never smoked. She has never used smokeless tobacco. She reports current alcohol use. She reports that she does not use drugs.   Allergies  Allergen Reactions   Tetanus Toxoids    Strawberry Extract Rash and Other (See Comments)    burning when touched on skin    Family History  Problem Relation Age of  Onset   Breast cancer Cousin 34       again @ 79      Prior to Admission medications   Medication Sig Start Date End Date Taking? Authorizing Provider  amLODipine (NORVASC) 5 MG tablet Take 5 mg by mouth every morning.    [provider]  aspirin EC 81 MG tablet Take 81 mg by mouth daily.    [provider]  B Complex Vitamins (VITAMIN B-COMPLEX) TABS Take by mouth. Vitamin c and folic acid by nature made    [provider]  chlorthalidone (HYGROTON) 25 MG tablet chlorthalidone 25 mg tablet    [provider]  cholecalciferol (VITAMIN D) 1000 UNITS tablet  Take 1,000 Units by mouth daily.    [provider]  co-enzyme Q-10 30 MG capsule Take 30 mg by mouth 3 (three) times daily.    [provider]  ketoconazole (NIZORAL) 2 % cream Apply 1 application topically daily. For eczema on face    [provider]  Multiple Vitamins-Minerals (CENTRUM SILVER PO) Take 1 tablet by mouth daily.    [provider]  Omega 3-6-9 Fatty Acids (TRIPLE OMEGA-3-6-9) CAPS Take 1 capsule by mouth daily.    [provider]  potassium chloride SA (K-DUR,KLOR-CON) 20 MEQ tablet Take 20 mEq by mouth every morning.    [provider]  telmisartan (MICARDIS) 40 MG tablet Take 1 tablet by mouth daily.    [provider]  valsartan-hydrochlorothiazide (DIOVAN-HCT) 160-12.5 MG per tablet Take 1 tablet by mouth every morning.    [provider]    Physical Exam: BP (!) 131/54 (BP Location: Right Arm)   Pulse 75   Temp 98.4 F (36.9 C) (Oral)   Resp 14   Ht 5\' 1"  (1.549 m)   Wt 72.1 kg   SpO2 97%   BMI 30.03 kg/m   General: 82 y.o. year-old female well developed well nourished in no acute distress.  Alert and oriented x3.  Tenderness with palpation of temporal region bilaterally. Cardiovascular: Regular rate and rhythm with no rubs or gallops.  No thyromegaly or JVD noted.  No lower extremity edema. 2/4 pulses in all 4 extremities. Respiratory: Clear to auscultation with no wheezes or rales. Good inspiratory effort. Abdomen: Soft nontender nondistended with normal bowel sounds x4 quadrants. Muskuloskeletal: No cyanosis, clubbing or edema noted bilaterally Neuro: CN II-XII intact, strength, sensation, reflexes Skin: No ulcerative lesions noted or rashes Psychiatry: Judgement and insight appear normal. Mood is appropriate for condition and setting          Labs on Admission:  Basic Metabolic Panel: Recent Labs  Lab 12/06/22 1755 12/06/22 1820  NA 138 138  K 3.2* 3.2*  CL 99 100  CO2 26  --    GLUCOSE 100* 101*  BUN 16 16  CREATININE 0.96 0.90  CALCIUM 9.3  --    Liver Function Tests: Recent Labs  Lab 12/06/22 1755  AST 35  ALT 50*  ALKPHOS 80  BILITOT 0.7  PROT 6.8  ALBUMIN 2.7*   No results for input(s): "LIPASE", "AMYLASE" in the last 168 hours. No results for input(s): "AMMONIA" in the last 168 hours. CBC: Recent Labs  Lab 12/06/22 1755 12/06/22 1820  WBC 10.4  --   NEUTROABS 7.9*  --   HGB 11.3* 11.6*  HCT 34.4* 34.0*  MCV 88.2  --   PLT 374  --    Cardiac Enzymes: No results for input(s): "CKTOTAL", "CKMB", "CKMBINDEX", "TROPONINI" in  the last 168 hours.  BNP (last 3 results) No results for input(s): "BNP" in the last 8760 hours.  ProBNP (last 3 results) No results for input(s): "PROBNP" in the last 8760 hours.  CBG: Recent Labs  Lab 12/06/22 1757  GLUCAP 88    Radiological Exams on Admission: CT HEAD WO CONTRAST  Result Date: 12/06/2022 CLINICAL DATA:  Neuro deficit, acute, stroke suspected EXAM: CT HEAD WITHOUT CONTRAST TECHNIQUE: Contiguous axial images were obtained from the base of the skull through the vertex without intravenous contrast. RADIATION DOSE REDUCTION: This exam was performed according to the departmental dose-optimization program which includes automated exposure control, adjustment of the mA and/or kV according to patient size and/or use of iterative reconstruction technique. COMPARISON:  None Available. FINDINGS: Brain: There is atrophy and chronic small vessel disease changes. No acute intracranial abnormality. Specifically, no hemorrhage, hydrocephalus, mass lesion, acute infarction, or significant intracranial injury. Vascular: No hyperdense vessel or unexpected calcification. Skull: No acute calvarial abnormality. Sinuses/Orbits: No acute findings Other: None IMPRESSION: Atrophy, chronic microvascular disease. No acute intracranial abnormality. Electronically Signed   By: Charlett Nose M.D.   On: 12/06/2022 19:14    EKG: I  independently viewed the EKG done and my findings are as followed: Normal sinus rhythm rate of 74.  Nonspecific ST-T changes.  QTc 412.  Assessment/Plan Present on Admission: **None**  Principal Problem:   Vision changes  Acute vision changes, unclear etiology, rule out giant cell arteritis Presented with transient left eye vision loss and intermittent left eye vision changes, onset around 8 AM today. Reportedly she had similar symptoms multiple times since a few days ago. Scalp tenderness with combing, elevated sed rate and CRP Received high-dose IV steroids, IV Solu-Medrol 1 g x 1. Continue IV Solu-Medrol 1 g daily x 3 days then start p.o. prednisone 50 mg daily. Consult vascular surgery in the morning for temporal artery biopsy, consulted Dr. Lenell Antu, via epic and secure chat. Consult ophthalmology in the morning, consulted Dr. Essie Hart, via epic and secure chat. Hold off aspirin and pharmacological DVT prophylaxis for now pending temporal artery biopsy as recommended by neurology. Start high intensity statin. Added GI prophylaxis while on high doses of steroids. Follow brain MRI>> it resulted negative for any acute intracranial findings. Seen by neurology, appreciate assistance.  Hypokalemia Serum potassium 3.2 Repleted orally Repeat BMP in the morning Check magnesium level in the morning.  History of hypertension Currently blood pressure is at goal without antihypertensives 126/65. Continue to monitor vital signs.  Recent history of hypotension and lightheadedness PT OT evaluation Avoid hypotension and dehydration Currently maintaining her MAP above 65 Gentle IV fluid hydration NS at 50 cc/h x 1 day.  Obesity BMI 30 Recommend weight loss outpatient with regular physical activity and healthy dieting.   Time: 75 minutes.   DVT prophylaxis: SCDs.  Code Status: Full code  Family Communication: Updated her husband at bedside.  Disposition Plan: Admitted to telemetry  medical unit  Consults called: Neurology  Admission status: Observation status.   Status is: Observation    Darlin Drop MD [facility] Pager [phone]  If 7PM-7AM, please contact night-coverage [email or web address] Password [facility]  12/06/2022, 11:52 PM

## 2022-12-06 NOTE — ED Provider Notes (Signed)
[place] EMERGENCY DEPARTMENT AT [facility] Provider Note   CSN: [id number] Arrival date & time: 12/06/22  1733     History  Chief Complaint  Patient presents with   Loss of Vision    Sara Boyer is a 82 y.o. female.  82 yo F with a chief complaints of left-sided vision change.  She tells me that this morning about 8 or 9:00 she realized the color vision in her left eye had changed.  She folic she was looking into an x-ray.  Seems to come and go throughout the day not currently having those symptoms.  She had some pain about her medial canthus at the onset of this but denies any eye pain currently.  She has been having a headache diffusely that is worse about the temples.  She also was having some pain when she chewed.  She denied one-sided numbness or weakness or difficulty speech or swallowing.  Denies trauma to the head or the neck.  Denied cough congestion or fever.        Home Medications Prior to Admission medications   Medication Sig Start Date End Date Taking? Authorizing Provider  amLODipine (NORVASC) 5 MG tablet Take 5 mg by mouth every morning.    [provider]  aspirin EC 81 MG tablet Take 81 mg by mouth daily.    [provider]  B Complex Vitamins (VITAMIN B-COMPLEX) TABS Take by mouth. Vitamin c and folic acid by nature made    [provider]  chlorthalidone (HYGROTON) 25 MG tablet chlorthalidone 25 mg tablet    [provider]  cholecalciferol (VITAMIN D) 1000 UNITS tablet Take 1,000 Units by mouth daily.    [provider]  co-enzyme Q-10 30 MG capsule Take 30 mg by mouth 3 (three) times daily.    [provider]  ketoconazole (NIZORAL) 2 % cream Apply 1 application topically daily. For eczema on face    [provider]  Multiple Vitamins-Minerals (CENTRUM SILVER PO) Take 1 tablet by mouth daily.    [provider]  Omega 3-6-9 Fatty Acids (TRIPLE OMEGA-3-6-9) CAPS Take 1  capsule by mouth daily.    [provider]  potassium chloride SA (K-DUR,KLOR-CON) 20 MEQ tablet Take 20 mEq by mouth every morning.    [provider]  telmisartan (MICARDIS) 40 MG tablet Take 1 tablet by mouth daily.    [provider]  valsartan-hydrochlorothiazide (DIOVAN-HCT) 160-12.5 MG per tablet Take 1 tablet by mouth every morning.    [provider]      Allergies    Tetanus toxoids and Strawberry extract    Review of Systems   Review of Systems  Physical Exam Updated Vital Signs BP (!) 131/54 (BP Location: Right Arm)   Pulse 75   Temp 98.4 F (36.9 C) (Oral)   Resp 14   Ht 5\' 1"  (1.549 m)   Wt 72.1 kg   SpO2 97%   BMI 30.03 kg/m  Physical Exam Vitals and nursing note reviewed.  Constitutional:      General: She is not in acute distress.    Appearance: She is well-developed. She is not diaphoretic.  HENT:     Head: Normocephalic and atraumatic.  Eyes:     Pupils: Pupils are equal, round, and reactive to light.  Cardiovascular:     Rate and Rhythm: Normal rate and regular rhythm.     Heart sounds: No murmur heard.    No friction rub. No  gallop.  Pulmonary:     Effort: Pulmonary effort is normal.     Breath sounds: No wheezing or rales.  Abdominal:     General: There is no distension.     Palpations: Abdomen is soft.     Tenderness: There is no abdominal tenderness.  Musculoskeletal:        General: No tenderness.     Cervical back: Normal range of motion and neck supple.  Skin:    General: Skin is warm and dry.  Neurological:     Mental Status: She is alert and oriented to person, place, and time.     Cranial Nerves: Cranial nerves 2-12 are intact.     Sensory: Sensation is intact.     Motor: Motor function is intact.     Coordination: Coordination is intact.     Comments: Signs of lens replacement in bilateral eyes.  Pupils are equal round and reactive to light and accommodation.  Extraocular motion intact.  Benign  neurologic exam.  Psychiatric:        Behavior: Behavior normal.     ED Results / Procedures / Treatments   Labs (all labs ordered are listed, but only abnormal results are displayed) Labs Reviewed  PROTIME-INR - Abnormal; Notable for the following components:      Result Value   Prothrombin Time 16.0 (*)    INR 1.3 (*)    All other components within normal limits  CBC - Abnormal; Notable for the following components:   Hemoglobin 11.3 (*)    HCT 34.4 (*)    All other components within normal limits  DIFFERENTIAL - Abnormal; Notable for the following components:   Neutro Abs 7.9 (*)    All other components within normal limits  COMPREHENSIVE METABOLIC PANEL - Abnormal; Notable for the following components:   Potassium 3.2 (*)    Glucose, Bld 100 (*)    Albumin 2.7 (*)    ALT 50 (*)    GFR, Estimated 59 (*)    All other components within normal limits  SEDIMENTATION RATE - Abnormal; Notable for the following components:   Sed Rate 114 (*)    All other components within normal limits  C-REACTIVE PROTEIN - Abnormal; Notable for the following components:   CRP 14.1 (*)    All other components within normal limits  I-STAT CHEM 8, ED - Abnormal; Notable for the following components:   Potassium 3.2 (*)    Glucose, Bld 101 (*)    Hemoglobin 11.6 (*)    HCT 34.0 (*)    All other components within normal limits  APTT  ETHANOL  CBG MONITORING, ED    EKG EKG Interpretation Date/Time:  Wednesday December 06 2022 17:38:40 EDT Ventricular Rate:  74 PR Interval:  184 QRS Duration:  104 QT Interval:  372 QTC Calculation: 412 R Axis:   64  Text Interpretation: Normal sinus rhythm Low voltage QRS Borderline ECG No significant change since last tracing Confirmed by Melene Plan [phone]) on 12/06/2022 8:31:17 PM  Radiology CT HEAD WO CONTRAST  Result Date: 12/06/2022 CLINICAL DATA:  Neuro deficit, acute, stroke suspected EXAM: CT HEAD WITHOUT CONTRAST TECHNIQUE: Contiguous axial  images were obtained from the base of the skull through the vertex without intravenous contrast. RADIATION DOSE REDUCTION: This exam was performed according to the departmental dose-optimization program which includes automated exposure control, adjustment of the mA and/or kV according to patient size and/or use of iterative reconstruction technique. COMPARISON:  None Available. FINDINGS: Brain:  There is atrophy and chronic small vessel disease changes. No acute intracranial abnormality. Specifically, no hemorrhage, hydrocephalus, mass lesion, acute infarction, or significant intracranial injury. Vascular: No hyperdense vessel or unexpected calcification. Skull: No acute calvarial abnormality. Sinuses/Orbits: No acute findings Other: None IMPRESSION: Atrophy, chronic microvascular disease. No acute intracranial abnormality. Electronically Signed   By: Charlett Nose M.D.   On: 12/06/2022 19:14    Procedures .Critical Care  Performed by: Melene Plan, DO Authorized by: Melene Plan, DO   Critical care provider statement:    Critical care time (minutes):  35   Critical care time was exclusive of:  Separately billable procedures and treating other patients   Critical care was time spent personally by me on the following activities:  Development of treatment plan with patient or surrogate, discussions with consultants, evaluation of patient's response to treatment, examination of patient, ordering and review of laboratory studies, ordering and review of radiographic studies, ordering and performing treatments and interventions, pulse oximetry, re-evaluation of patient's condition and review of old charts   Care discussed with: admitting provider       Medications Ordered in ED Medications  sodium chloride flush (NS) 0.9 % injection 3 mL (has no administration in time range)    ED Course/ Medical Decision Making/ A&P                             Medical Decision Making Amount and/or Complexity of Data  Reviewed Labs: ordered. Radiology: ordered.   82 yo F with a chief complaints of left-sided vision change.  This seems to be monocular by history.  Going on since this morning.  Mostly painless.  She however is also describing some symptoms that would be concerning for temporal arteritis, has had temporal headaches as well as jaw claudication.  I discussed the case with neurology, Dr. Wilford Corner recommended CT angiogram of the head and neck and MRI of the brain. He will come and see the patient at bedside.   No significant electrolyte abnormalities no significant anemia CT of the head without contrast without intracranial hemorrhage on my independent interpretation.   Patient care was signed out to Dr. Clayborne Dana, please see his note for further details care in the ED.  The patients results and plan were reviewed and discussed.   Any x-rays performed were independently reviewed by myself.   Differential diagnosis were considered with the presenting HPI.  Medications  sodium chloride flush (NS) 0.9 % injection 3 mL (has no administration in time range)    Vitals:   12/06/22 1741 12/06/22 1745 12/06/22 2131  BP:  (!) 114/48 (!) 131/54  Pulse:  74 75  Resp:  16 14  Temp:  98.7 F (37.1 C) 98.4 F (36.9 C)  TempSrc:  Oral Oral  SpO2:  99% 97%  Weight: 72.1 kg    Height: 5\' 1"  (1.549 m)      Final diagnoses:  Vision loss of left eye    Admission/ observation were discussed with the admitting physician, patient and/or family and they are comfortable with the plan.         Final Clinical Impression(s) / ED Diagnoses Final diagnoses:  Vision loss of left eye    Rx / DC Orders ED Discharge Orders     None         Melene Plan, DO 12/06/22 2316

## 2022-12-06 NOTE — ED Notes (Signed)
Patient is being discharged from the Urgent Care and sent to the Emergency Department via POV . Per Joaquin Courts, patient is in need of higher level of care due to stroke like sxs. Patient is aware and verbalizes understanding of plan of care.  Vitals:   12/06/22 1651  BP: 115/71  Pulse: 72  Resp: 20  Temp: 98.1 F (36.7 C)  SpO2: 97%

## 2022-12-06 NOTE — ED Triage Notes (Signed)
Pt c/o int loss of vision in left eye since 0800 today. Pt states her head is tender and it was hard to open her mouth this morning to eat cereal.

## 2022-12-06 NOTE — ED Provider Notes (Signed)
11:08 PM Assumed care from Dr. Adela Lank, please see their note for full history, physical and decision making until this point. In brief this is a 82 y.o. year old female who presented to the ED tonight with Loss of Vision     Left eye color vision changes. Started this AM. Possibly GCA. Wilford Corner to see. CTA/MRI and dispo per Wilford Corner.   Dr. Wilford Corner thinks GCA. Recommends solumedrol, MRI, admit.   D/w Dr. Margo Aye for admit.   Labs, studies and imaging reviewed by myself and considered in medical decision making if ordered. Imaging interpreted by radiology.  Labs Reviewed  PROTIME-INR - Abnormal; Notable for the following components:      Result Value   Prothrombin Time 16.0 (*)    INR 1.3 (*)    All other components within normal limits  CBC - Abnormal; Notable for the following components:   Hemoglobin 11.3 (*)    HCT 34.4 (*)    All other components within normal limits  DIFFERENTIAL - Abnormal; Notable for the following components:   Neutro Abs 7.9 (*)    All other components within normal limits  COMPREHENSIVE METABOLIC PANEL - Abnormal; Notable for the following components:   Potassium 3.2 (*)    Glucose, Bld 100 (*)    Albumin 2.7 (*)    ALT 50 (*)    GFR, Estimated 59 (*)    All other components within normal limits  SEDIMENTATION RATE - Abnormal; Notable for the following components:   Sed Rate 114 (*)    All other components within normal limits  C-REACTIVE PROTEIN - Abnormal; Notable for the following components:   CRP 14.1 (*)    All other components within normal limits  I-STAT CHEM 8, ED - Abnormal; Notable for the following components:   Potassium 3.2 (*)    Glucose, Bld 101 (*)    Hemoglobin 11.6 (*)    HCT 34.0 (*)    All other components within normal limits  APTT  ETHANOL  CBG MONITORING, ED    CT HEAD WO CONTRAST  Final Result    CT ANGIO HEAD NECK W WO CM    (Results Pending)  MR BRAIN WO CONTRAST    (Results Pending)    No follow-ups on file.     Marily Memos, MD 12/06/22 2350

## 2022-12-06 NOTE — ED Triage Notes (Signed)
Pt reports she currently has pneumonia x 2 days.   Pt states she was just watching tv earlier today and noticed she could not see out of her left eye. The vision came back but did it again 4 more times today.   States her jaw is hurting . She is lightheaded and dizzy  Pt denies chest pain.  States she is currently on an antibiotic.

## 2022-12-06 NOTE — ED Provider Notes (Signed)
[facility]    CSN: [id number] Arrival date & time: 12/06/22  1642      History   Chief Complaint Chief Complaint  Patient presents with   Loss of vision Left Eye     HPI Sara Boyer is a 82 y.o. female.  HPI Patient presents for evaluation of left eye intermittent loss of vision this morning.  Patient reports that at present she is unable to see anything out of her left eye.  Patient reports this is never happened previously.  She became more concerned when she developed left-sided jaw pain earlier this morning and this has continued to occur intermittently throughout the day.  She was recently diagnosed with pneumonia x 2 days ago and is currently taking antibiotics however is uncertain of what medication she prescribed. She endorses associated lightheadedness and dizziness. Patient denies any shortness of breath or chest pain.  Patient has no known history of neurovascular disease.  All symptoms happen abruptly today. 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 04/27/2015   Complete rotator cuff tear of left shoulder 01/24/2012    Past Surgical History:  Procedure Laterality Date   ABDOMINAL HYSTERECTOMY     APPENDECTOMY     BREAST CYST EXCISION Right    BREAST SURGERY     right breast surgery for benign lump    corrective surgery to open fallopian tubes      DILATION AND CURETTAGE OF UTERUS     several    HEMORRHOID SURGERY     lobe removed from thyroid      SHOULDER OPEN ROTATOR CUFF REPAIR  01/24/2012   Procedure: ROTATOR CUFF REPAIR SHOULDER OPEN;  Surgeon: Jacki Cones, MD;  Location: [facility];  Service: Orthopedics;  Laterality: Left;  Left Shoulder  Rotator Cuff Repair with Graft and Anchors and acrominectomy   TONSILLECTOMY      OB History   No obstetric history on file.      Home Medications    Prior to Admission medications   Medication Sig Start Date End Date Taking? Authorizing Provider  amLODipine (NORVASC) 5 MG tablet Take 5 mg by mouth every morning.    [provider]  aspirin EC 81 MG tablet Take 81 mg by mouth daily.    [provider]  B Complex Vitamins (VITAMIN B-COMPLEX) TABS Take by mouth. Vitamin c and folic acid by nature made    [provider]  chlorthalidone (HYGROTON) 25 MG tablet chlorthalidone 25 mg tablet    [provider]  cholecalciferol (VITAMIN D) 1000 UNITS tablet Take 1,000 Units by mouth daily.    [provider]  co-enzyme Q-10 30 MG capsule Take 30 mg by mouth 3 (three) times daily.    [provider]  ketoconazole (NIZORAL) 2 % cream Apply 1 application topically daily. For eczema on face    [provider]  Multiple Vitamins-Minerals (CENTRUM SILVER PO) Take 1 tablet by mouth daily.    [provider]  Omega 3-6-9 Fatty Acids (TRIPLE OMEGA-3-6-9) CAPS Take 1 capsule by mouth daily.    [provider]  potassium chloride SA (K-DUR,KLOR-CON) 20 MEQ tablet Take 20 mEq by mouth every morning.    [provider]  telmisartan (MICARDIS) 40 MG tablet Take 1 tablet by mouth daily.    [provider]  valsartan-hydrochlorothiazide (DIOVAN-HCT) 160-12.5 MG per tablet Take 1 tablet by mouth every morning.    [provider]    Family History Family History  Problem Relation Age of Onset   Breast cancer Cousin 75       again @ 7    Social History Social History   Tobacco Use   Smoking status: Never   Smokeless tobacco: Never  Substance Use Topics   Alcohol use: Yes    Comment: occasional glass of wine or cocktail    Drug use: No     Allergies   Tetanus toxoids and Strawberry  extract   Review of Systems Review of Systems Pertinent negatives listed in HPI   Physical Exam Triage Vital Signs ED Triage Vitals  Enc Vitals Group     BP 12/06/22 1651 115/71     Pulse Rate 12/06/22 1651 72     Resp 12/06/22 1651 20     Temp 12/06/22 1651 98.1 F (36.7 C)     Temp Source 12/06/22 1651 Oral     SpO2 12/06/22 1651 97 %     Weight --      Height --      Head Circumference --      Peak Flow --      Pain Score 12/06/22 1649 6     Pain Loc --      Pain Edu? --      Excl. in GC? --    No data found.  Updated Vital Signs BP 115/71 (BP Location: Right Arm)   Pulse 72   Temp 98.1 F (36.7 C) (Oral)   Resp 20   SpO2 97%   Visual Acuity Right Eye Distance:   Left Eye Distance:   Bilateral Distance:    Right Eye Near:   Left Eye Near:    Bilateral Near:     Physical Exam Constitutional:      General: She is not in acute distress.    Appearance: She is not toxic-appearing.  HENT:     Head: Normocephalic and atraumatic.     Jaw: Pain on movement present.   Eyes:     Extraocular Movements: Extraocular movements intact.     Pupils: Pupils are equal, round, and reactive to light.  Cardiovascular:     Rate and Rhythm: Normal rate and regular rhythm.  Pulmonary:     Effort: Pulmonary effort is normal.     Breath sounds: Normal breath sounds.  Musculoskeletal:     Cervical back: Normal range of motion.  Neurological:     General: No focal deficit present.     Mental Status: She is alert.    UC Treatments / Results  Labs (all labs ordered are listed, but only abnormal results are displayed) Labs Reviewed - No data to display  EKG Normal sinus rhythm with no ST changes, 70 bpm  Radiology No results found.  Procedures Procedures (including critical care time)  Medications Ordered in UC Medications - No data to display  Initial Impression / Assessment and Plan / UC Course  I have reviewed the triage vital signs and the nursing  notes.  Pertinent labs & imaging results that were available during my care of the patient were reviewed by me and  considered in my medical decision making (see chart for details).    Vision changes, left and Jaw Pain  Of unknown etiology given limitations of urgent care patient warrants further workup and evaluation in the setting of the emergency department given acute onset loss of vision in the left eye with left-sided associated jaw pain.  EKG shows no acute ischemic changes therefore patient likely would benefit from a head CT determine the cause of her visual changes.Patient is being to go to [facility] emergency department.  Vital signs are stable and patient and husband agree to go immediately to [facility] emergency department therefore transport via personal automobile is reasonable. Vital signs stable patient is alert and ambulatory.  Final diagnoses:  Vision changes, left eye   Jaw pain   Discharge Instructions   None    ED Prescriptions   None    PDMP not reviewed this encounter.   Bing Neighbors, NP 12/06/22 1731

## 2022-12-06 NOTE — Consult Note (Signed)
Neurology Consultation  Reason for Consult: Headache and visual changes Referring Physician: Dr. Melene Plan  CC: Headache and visual changes  History is obtained from: Patient, chart  HPI: Sara Boyer is a 82 y.o. female past medical history of hypertension well-controlled with medications and recently discontinued medications due to being hypotensive, presents to the emergency department for evaluation of holocephalic headache and visual changes as described below. Last known well was somewhere on Saturday when she started having episodes of extreme tenderness mostly in the back of her head but involving the whole head.  She also had visual symptoms which she describes as sudden loss of vision in the left eye followed by gradual return of vision with loss of color sensation in that eye.  Symptoms lasted a few hours and then resolved and then happened again today which made her come to the ED. She also reports some jaw claudication while trying to eat since this morning.  She did not have any tingling numbness.  No chest pain shortness of breath. She reports some shoulder discomfort ongoing for past few weeks for which she has been seeing an orthopedic physician.  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   LKW: Couple days ago at least IV thrombolysis given?: no, outside the window-symptoms completely resolved Premorbid modified Rankin scale (mRS): 0   ROS: Full ROS was performed and is negative except as noted in the HPI.  Past Medical History:  Diagnosis Date   Anemia    hx of as a child    Hypertension    Pneumonia    hx of    Shortness of breath    with exertion    Family History  Problem Relation Age of Onset   Breast cancer Cousin 20       again @ 47    Social History:   reports that she has never smoked. She has never used smokeless tobacco. She reports current alcohol use. She reports that she does not use  drugs.  Medications  Current Facility-Administered Medications:    sodium chloride flush (NS) 0.9 % injection 3 mL, 3 mL, Intravenous, Once, Melene Plan, DO  Current Outpatient Medications:    amLODipine (NORVASC) 5 MG tablet, Take 5 mg by mouth every morning., Disp: , Rfl:    aspirin EC 81 MG tablet, Take 81 mg by mouth daily., Disp: , Rfl:    B Complex Vitamins (VITAMIN B-COMPLEX) TABS, Take by mouth. Vitamin c and folic acid by nature made, Disp: , Rfl:    chlorthalidone (HYGROTON) 25 MG tablet, chlorthalidone 25 mg tablet, Disp: , Rfl:    cholecalciferol (VITAMIN D) 1000 UNITS tablet, Take 1,000 Units by mouth daily., Disp: , Rfl:    co-enzyme Q-10 30 MG capsule, Take 30 mg by mouth 3 (three) times daily., Disp: , Rfl:    ketoconazole (NIZORAL) 2 % cream, Apply 1 application topically daily. For eczema on face, Disp: , Rfl:    Multiple Vitamins-Minerals (CENTRUM SILVER PO), Take 1 tablet by mouth daily., Disp: , Rfl:    Omega 3-6-9 Fatty Acids (TRIPLE OMEGA-3-6-9) CAPS, Take 1 capsule by mouth daily., Disp: , Rfl:    potassium chloride SA (K-DUR,KLOR-CON) 20 MEQ tablet, Take 20 mEq by mouth every morning., Disp: , Rfl:    telmisartan (MICARDIS) 40 MG tablet, Take 1 tablet by mouth daily., Disp: , Rfl:    valsartan-hydrochlorothiazide (DIOVAN-HCT) 160-12.5 MG per tablet, Take 1 tablet by mouth every morning., Disp: ,  Rfl:   Exam: Current vital signs: BP (!) 131/54 (BP Location: Right Arm)   Pulse 75   Temp 98.4 F (36.9 C) (Oral)   Resp 14   Ht 5\' 1"  (1.549 m)   Wt 72.1 kg   SpO2 97%   BMI 30.03 kg/m  Vital signs in last 24 hours: Temp:  [98.1 F (36.7 C)-98.7 F (37.1 C)] 98.4 F (36.9 C) (07/10 2131) Pulse Rate:  [72-75] 75 (07/10 2131) Resp:  [14-20] 14 (07/10 2131) BP: (114-131)/(48-71) 131/54 (07/10 2131) SpO2:  [97 %-99 %] 97 % (07/10 2131) Weight:  [72.1 kg] 72.1 kg (07/10 1741) General: Awake alert in no distress HEENT: Normocephalic, atraumatic, there is  palpable tenderness on the back and sides of the head but not so much in the temporal region.  Temporal pulses are intact bilaterally.  Touching her head and combing her hair according to her gives her a lot of discomfort CVS: Regular rhythm Respiratory: Breathing well saturating normally on room air Abdomen nondistended nontender Neurological exam Awake alert vented x 3.  No aphasia.  No dysarthria. Cranial nerves II to XII intact. Motor examination with no drift in any of the 4 extremities. Sensation intact to light touch without extinction Coordination examination reveals no dysmetria NIH stroke scale-0    Labs I have reviewed labs in epic and the results pertinent to this consultation are: CBC    Component Value Date/Time   WBC 10.4 12/06/2022 1755   RBC 3.90 12/06/2022 1755   HGB 11.6 (L) 12/06/2022 1820   HCT 34.0 (L) 12/06/2022 1820   PLT 374 12/06/2022 1755   MCV 88.2 12/06/2022 1755   MCH 29.0 12/06/2022 1755   MCHC 32.8 12/06/2022 1755   RDW 13.3 12/06/2022 1755   LYMPHSABS 1.4 12/06/2022 1755   MONOABS 0.9 12/06/2022 1755   EOSABS 0.1 12/06/2022 1755   BASOSABS 0.1 12/06/2022 1755    CMP     Component Value Date/Time   NA 138 12/06/2022 1820   K 3.2 (L) 12/06/2022 1820   CL 100 12/06/2022 1820   CO2 26 12/06/2022 1755   GLUCOSE 101 (H) 12/06/2022 1820   BUN 16 12/06/2022 1820   CREATININE 0.90 12/06/2022 1820   CALCIUM 9.3 12/06/2022 1755   PROT 6.8 12/06/2022 1755   ALBUMIN 2.7 (L) 12/06/2022 1755   AST 35 12/06/2022 1755   ALT 50 (H) 12/06/2022 1755   ALKPHOS 80 12/06/2022 1755   BILITOT 0.7 12/06/2022 1755   GFRNONAA 59 (L) 12/06/2022 1755   GFRAA >90 01/19/2012 1330   ESR-114 CRP 14.1  Imaging I have reviewed the images obtained: CT head: No acute changes  Assessment: 82 year old woman with past history of hypertension which was well-controlled and recently started developing hypotension on antihypertensives which were since discontinued,  presenting for evaluation of holocephalic headache as well as monocular vision loss that has happened multiple times since Saturday along with color desaturation in the left eye which has since resolved.  She also reports jaw claudication. 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  Recommendations: Admit to hospitalist Frequent neurochecks MRI brain without contrast Telemetry A1c Lipid panel Solu-Medrol 1 g IV x 1-I would do that for about 3 days and then started on p.o. prednisone 50 mg daily. Consult vascular surgery in the morning for temporal artery biopsy. I would hold off on aspirin for now pending temporal artery biopsy. High intensity statin PT/OT/speech  therapy She will need an ophthalmological exam-consider nonemergent inpatient consultation tomorrow morning.  Plan was discussed with Dr. Erin Hearing. -- Milon Dikes, MD Neurologist [facility] Pager: [phone]

## 2022-12-07 ENCOUNTER — Observation Stay (HOSPITAL_COMMUNITY): Payer: PPO

## 2022-12-07 DIAGNOSIS — H5462 Unqualified visual loss, left eye, normal vision right eye: Secondary | ICD-10-CM | POA: Diagnosis present

## 2022-12-07 DIAGNOSIS — J40 Bronchitis, not specified as acute or chronic: Secondary | ICD-10-CM | POA: Diagnosis present

## 2022-12-07 DIAGNOSIS — Z6830 Body mass index (BMI) 30.0-30.9, adult: Secondary | ICD-10-CM | POA: Diagnosis not present

## 2022-12-07 DIAGNOSIS — Z79899 Other long term (current) drug therapy: Secondary | ICD-10-CM | POA: Diagnosis not present

## 2022-12-07 DIAGNOSIS — E876 Hypokalemia: Secondary | ICD-10-CM | POA: Diagnosis present

## 2022-12-07 DIAGNOSIS — M316 Other giant cell arteritis: Secondary | ICD-10-CM | POA: Diagnosis present

## 2022-12-07 DIAGNOSIS — I10 Essential (primary) hypertension: Secondary | ICD-10-CM | POA: Diagnosis present

## 2022-12-07 DIAGNOSIS — G459 Transient cerebral ischemic attack, unspecified: Secondary | ICD-10-CM | POA: Diagnosis not present

## 2022-12-07 DIAGNOSIS — E669 Obesity, unspecified: Secondary | ICD-10-CM | POA: Diagnosis present

## 2022-12-07 DIAGNOSIS — D649 Anemia, unspecified: Secondary | ICD-10-CM | POA: Diagnosis present

## 2022-12-07 DIAGNOSIS — E785 Hyperlipidemia, unspecified: Secondary | ICD-10-CM | POA: Diagnosis present

## 2022-12-07 DIAGNOSIS — Z7982 Long term (current) use of aspirin: Secondary | ICD-10-CM | POA: Diagnosis not present

## 2022-12-07 DIAGNOSIS — Z887 Allergy status to serum and vaccine status: Secondary | ICD-10-CM | POA: Diagnosis not present

## 2022-12-07 DIAGNOSIS — Z91018 Allergy to other foods: Secondary | ICD-10-CM | POA: Diagnosis not present

## 2022-12-07 LAB — I-STAT CHEM 8, ED
BUN: 16 mg/dL (ref 8–23)
Calcium, Ion: 1.17 mmol/L (ref 1.15–1.40)
Chloride: 100 mmol/L (ref 98–111)
Creatinine, Ser: 0.9 mg/dL (ref 0.44–1.00)
Glucose, Bld: 101 mg/dL — ABNORMAL HIGH (ref 70–99)
HCT: 34 % — ABNORMAL LOW (ref 36.0–46.0)
Hemoglobin: 11.6 g/dL — ABNORMAL LOW (ref 12.0–15.0)
Potassium: 3.2 mmol/L — ABNORMAL LOW (ref 3.5–5.1)
Sodium: 138 mmol/L (ref 135–145)
TCO2: 27 mmol/L (ref 22–32)

## 2022-12-07 LAB — PHOSPHORUS: Phosphorus: 3.3 mg/dL (ref 2.5–4.6)

## 2022-12-07 LAB — BASIC METABOLIC PANEL
Anion gap: 11 (ref 5–15)
BUN: 14 mg/dL (ref 8–23)
CO2: 24 mmol/L (ref 22–32)
Calcium: 9 mg/dL (ref 8.9–10.3)
Chloride: 100 mmol/L (ref 98–111)
Creatinine, Ser: 0.93 mg/dL (ref 0.44–1.00)
GFR, Estimated: >60 mL/min (ref >60)
Glucose, Bld: 160 mg/dL — ABNORMAL HIGH (ref 70–99)
Potassium: 3.3 mmol/L — ABNORMAL LOW (ref 3.5–5.1)
Sodium: 135 mmol/L (ref 135–145)

## 2022-12-07 LAB — CBC
HCT: 33.6 % — ABNORMAL LOW (ref 36.0–46.0)
Hemoglobin: 11.4 g/dL — ABNORMAL LOW (ref 12.0–15.0)
MCH: 30 pg (ref 26.0–34.0)
MCHC: 33.9 g/dL (ref 30.0–36.0)
MCV: 88.4 fL (ref 80.0–100.0)
Platelets: 328 10*3/uL (ref 150–400)
RBC: 3.8 MIL/uL — ABNORMAL LOW (ref 3.87–5.11)
RDW: 13.4 % (ref 11.5–15.5)
WBC: 9.9 10*3/uL (ref 4.0–10.5)
nRBC: 0 % (ref 0.0–0.2)

## 2022-12-07 LAB — LIPID PANEL
Cholesterol: 160 mg/dL (ref 0–200)
HDL: 32 mg/dL — ABNORMAL LOW (ref >40)
LDL Cholesterol: 111 mg/dL — ABNORMAL HIGH (ref 0–99)
Total CHOL/HDL Ratio: 5 RATIO
Triglycerides: 83 mg/dL (ref <150)
VLDL: 17 mg/dL (ref 0–40)

## 2022-12-07 LAB — MAGNESIUM: Magnesium: 1.6 mg/dL — ABNORMAL LOW (ref 1.7–2.4)

## 2022-12-07 MED ORDER — MELATONIN 5 MG PO TABS
5.0000 mg | ORAL_TABLET | Freq: Every evening | ORAL | Status: DC | PRN
  Filled 2022-12-07: qty 1

## 2022-12-07 MED ORDER — POLYETHYLENE GLYCOL 3350 17 G PO PACK: 17.0000 g | PACK | Freq: Every day | ORAL | Status: DC | PRN

## 2022-12-07 MED ORDER — ACETAMINOPHEN 325 MG PO TABS
650.0000 mg | ORAL_TABLET | Freq: Four times a day (QID) | ORAL | Status: DC | PRN
  Filled 2022-12-07: qty 2

## 2022-12-07 MED ORDER — ATORVASTATIN CALCIUM 40 MG PO TABS
40.0000 mg | ORAL_TABLET | Freq: Every day | ORAL | Status: DC
  Filled 2022-12-07 (×2): qty 1

## 2022-12-07 MED ORDER — SODIUM CHLORIDE 0.9 % IV SOLN: INTRAVENOUS | Status: DC

## 2022-12-07 MED ORDER — POTASSIUM CHLORIDE CRYS ER 20 MEQ PO TBCR
20.0000 meq | EXTENDED_RELEASE_TABLET | Freq: Two times a day (BID) | ORAL | Status: DC
  Administered 2022-12-08 – 2022-12-09 (×3): 20 meq via ORAL
  Filled 2022-12-07 (×3): qty 1

## 2022-12-07 MED ORDER — POTASSIUM CHLORIDE CRYS ER 20 MEQ PO TBCR
40.0000 meq | EXTENDED_RELEASE_TABLET | ORAL | Status: AC
  Administered 2022-12-07 (×2): 40 meq via ORAL
  Filled 2022-12-07 (×2): qty 2

## 2022-12-07 MED ORDER — PROCHLORPERAZINE EDISYLATE 10 MG/2ML IJ SOLN: 5.0000 mg | Freq: Four times a day (QID) | INTRAMUSCULAR | Status: DC | PRN

## 2022-12-07 MED ORDER — SODIUM CHLORIDE 0.9 % IV SOLN
1000.0000 mg | Freq: Every day | INTRAVENOUS | Status: DC
  Administered 2022-12-07 – 2022-12-08 (×2): 1000 mg via INTRAVENOUS
  Filled 2022-12-07 (×3): qty 16

## 2022-12-07 MED ORDER — PANTOPRAZOLE SODIUM 40 MG PO TBEC
40.0000 mg | DELAYED_RELEASE_TABLET | Freq: Every day | ORAL | Status: DC
  Administered 2022-12-07 – 2022-12-09 (×3): 40 mg via ORAL
  Filled 2022-12-07 (×3): qty 1

## 2022-12-07 MED ORDER — POTASSIUM CHLORIDE CRYS ER 20 MEQ PO TBCR
40.0000 meq | EXTENDED_RELEASE_TABLET | Freq: Once | ORAL | Status: AC
  Administered 2022-12-07: 40 meq via ORAL
  Filled 2022-12-07: qty 2

## 2022-12-07 MED ORDER — ATORVASTATIN CALCIUM 80 MG PO TABS
80.0000 mg | ORAL_TABLET | Freq: Every day | ORAL | Status: DC
  Filled 2022-12-07: qty 1

## 2022-12-07 NOTE — Progress Notes (Addendum)
[facility] Progress Note Patient: Sara Boyer ZOX:[id number] DOB: Aug 04, 1940 DOA: 12/06/2022  DOS: the patient was seen and examined on 12/07/2022  Brief hospital course: Patient with PMH of HTN presented to the hospital with complaints of vision changes and left sided facial pain ongoing since 7/6. Recently seen in the PCPs office for hypotension which is ongoing since April 2024.  Patient was asked to stop taking her blood pressure medications. Also was seen for upper respiratory infection symptoms and was started on doxycycline for bronchitis on 7/8. Presents with sudden onset of painless visual changes on the left eye with difficulty with colors for 1 day prior to admission. Neurology was consulted.  Suspecting temporal arteritis.  Assessment and Plan: Temporal arteritis. Blurry vision/loss of color clarity. Left-sided headache. Presents with 1 day onset of vision issues particularly related to colors. Also reports sensitivity with pain on left side of her face. CT scan of the head as well as MRI brain negative for any acute abnormality. CT angiography head and neck also negative for any large vessel occlusion. ESR 114.  CRP 14.1.  Patient does not have any other acute inflammatory processes. Patient was given IV steroids with vision returning back to her normal self for now. Vascular surgery is consulted. Ophthalmology consulted as well. No other focal deficit. Aspirin on hold for now. Will continue IV steroids today and transition to p.o. with a prolonged taper. PT OT consulted.  Bronchitis. Patient was on doxycycline. While I am not convinced that the patient actually has any pneumonia or significant evidence of bacterial infection which I will resume antibiotic for a short course.  Hypertension with recent hypotension. Patient has been having history of hypertension and has been on blood pressure medication. Recently in April started to have low blood pressure  with frequent dizziness episodes. Medication for her blood pressure has been discontinued now. Monitor.  Hypokalemia. Currently being replaced. Monitor.  Normocytic anemia. H&H relatively stable. Active bleeding. Will monitor.  Obesity Body mass index is 30.03 kg/m.  Placing the pt at higher risk of poor outcomes.    Subjective: No nausea no vomiting.  Reports vision is back to normal.  Reports that she had difficulty with color as persons were appearing to her as if she is looking at 'film negative' initially she had issues with bradycardia and later on she had issues with yellow colors.  Currently her only complaint is left headache.  Physical Exam: General: in Mild distress, No Rash Cardiovascular: S1 and S2 Present, No Murmur Respiratory: Good respiratory effort, Bilateral Air entry present. No Crackles, No wheezes Abdomen: Bowel Sound present, No tenderness Extremities: No edema Neuro: Alert and oriented x3, no new focal deficit, field of vision normal.  Pupils are equal and reactive to light although patient is photosensitive.  Data Reviewed: I have Reviewed nursing notes, Vitals, and Lab results. Since last encounter, pertinent lab results CBC and BMP   . I have ordered test including CBC and BMP  .   Disposition: Status is: Observation  SCDs Start: 12/06/22 2352   Family Communication: No one at bedside Level of care: Telemetry Medical   Vitals:   12/07/22 0600 12/07/22 0700 12/07/22 0800 12/07/22 1006  BP: (!) 111/54 (!) 114/57 128/70   Pulse: 73 81 61   Resp: 11 20 17    Temp:    98.4 F (36.9 C)  TempSrc:    Oral  SpO2: 94% 96% 99%   Weight:      Height:  Author: Lynden Oxford, MD 12/07/2022 10:09 AM  Please look on [email or web address] to find out who is on call.

## 2022-12-07 NOTE — ED Notes (Signed)
ED TO INPATIENT HANDOFF REPORT  ED Nurse Name and Phone #: Vernona Rieger [phone]  S Name/Age/Gender Sara Boyer 82 y.o. female Room/Bed: 046C/046C  Code Status   Code Status: Full Code  Home/SNF/Other Home Patient oriented to: self, place, time, and situation Is this baseline? Yes   Triage Complete: Triage complete  Chief Complaint Vision changes [H53.9]  Triage Note Pt c/o int loss of vision in left eye since 0800 today. Pt states her head is tender and it was hard to open her mouth this morning to eat cereal.   Allergies Allergies  Allergen Reactions   Amlodipine Besylate Other (See Comments)   Lisinopril Other (See Comments)   Tetanus Toxoids    Strawberry Extract Rash and Other (See Comments)    burning when touched on skin    Level of Care/Admitting Diagnosis ED Disposition     ED Disposition  Admit   Condition  --   Comment  Hospital Area: [facility] [[id number]]  Level of Care: Telemetry Medical [104]  May place patient in observation at [facility] or [facility] if equivalent level of care is available:: No  Covid Evaluation: Asymptomatic - no recent exposure (last 10 days) testing not required  Diagnosis: Vision changes [960454]  Admitting Physician: Darlin Drop [[id number]]  Attending Physician: Darlin Drop [[id number]]          B Medical/Surgery History Past Medical History:  Diagnosis Date   Anemia    hx of as a child    Hypertension    Pneumonia    hx of    Shortness of breath    with exertion   Past Surgical History:  Procedure Laterality Date   ABDOMINAL HYSTERECTOMY     APPENDECTOMY     BREAST CYST EXCISION Right    BREAST SURGERY     right breast surgery for benign lump    corrective surgery to open fallopian tubes      DILATION AND CURETTAGE OF UTERUS     several    HEMORRHOID SURGERY     lobe removed from thyroid      SHOULDER OPEN ROTATOR CUFF REPAIR  01/24/2012   Procedure: ROTATOR CUFF REPAIR SHOULDER  OPEN;  Surgeon: Jacki Cones, MD;  Location: [facility];  Service: Orthopedics;  Laterality: Left;  Left Shoulder Rotator Cuff Repair with Graft and Anchors and acrominectomy   TONSILLECTOMY       A IV Location/Drains/Wounds Patient Lines/Drains/Airways Status     Active Line/Drains/Airways     Name Placement date Placement time Site Days   Peripheral IV 12/06/22 18 G 1.16" Right Antecubital 12/06/22  2130  Antecubital  1            Intake/Output Last 24 hours No intake or output data in the 24 hours ending 12/07/22 1233  Labs/Imaging Results for orders placed or performed during the hospital encounter of 12/06/22 (from the past 48 hour(s))  Protime-INR     Status: Abnormal   Collection Time: 12/06/22  5:55 PM  Result Value Ref Range   Prothrombin Time 16.0 (H) 11.4 - 15.2 seconds   INR 1.3 (H) 0.8 - 1.2    Comment: (NOTE) INR goal varies based on device and disease states. Performed at [facility], [address], [place], [place] [address]   APTT     Status: None   Collection Time: 12/06/22  5:55 PM  Result Value Ref Range   aPTT 35 24 - 36  seconds    Comment: Performed at [facility], [address] [place], [place] [address]  CBC     Status: Abnormal   Collection Time: 12/06/22  5:55 PM  Result Value Ref Range   WBC 10.4 4.0 - 10.5 K/uL   RBC 3.90 3.87 - 5.11 MIL/uL   Hemoglobin 11.3 (L) 12.0 - 15.0 g/dL   HCT 60.4 (L) 54.0 - 98.1 %   MCV 88.2 80.0 - 100.0 fL   MCH 29.0 26.0 - 34.0 pg   MCHC 32.8 30.0 - 36.0 g/dL   RDW 19.1 47.8 - 29.5 %   Platelets 374 150 - 400 K/uL   nRBC 0.0 0.0 - 0.2 %    Comment: Performed at [facility], [address], [place], [place] [address]  Differential     Status: Abnormal   Collection Time: 12/06/22  5:55 PM  Result Value Ref Range   Neutrophils Relative % 75 %   Neutro Abs 7.9 (H) 1.7 - 7.7 K/uL   Lymphocytes Relative 13 %   Lymphs Abs 1.4 0.7 - 4.0 K/uL   Monocytes Relative 9 %   Monocytes  Absolute 0.9 0.1 - 1.0 K/uL   Eosinophils Relative 1 %   Eosinophils Absolute 0.1 0.0 - 0.5 K/uL   Basophils Relative 1 %   Basophils Absolute 0.1 0.0 - 0.1 K/uL   Immature Granulocytes 1 %   Abs Immature Granulocytes 0.06 0.00 - 0.07 K/uL    Comment: Performed at [facility], [address], [place], [place] [address]  Comprehensive metabolic panel     Status: Abnormal   Collection Time: 12/06/22  5:55 PM  Result Value Ref Range   Sodium 138 135 - 145 mmol/L   Potassium 3.2 (L) 3.5 - 5.1 mmol/L   Chloride 99 98 - 111 mmol/L   CO2 26 22 - 32 mmol/L   Glucose, Bld 100 (H) 70 - 99 mg/dL    Comment: Glucose reference range applies only to samples taken after fasting for at least 8 hours.   BUN 16 8 - 23 mg/dL   Creatinine, Ser 4.69 0.44 - 1.00 mg/dL   Calcium 9.3 8.9 - 62.9 mg/dL   Total Protein 6.8 6.5 - 8.1 g/dL   Albumin 2.7 (L) 3.5 - 5.0 g/dL   AST 35 15 - 41 U/L   ALT 50 (H) 0 - 44 U/L   Alkaline Phosphatase 80 38 - 126 U/L   Total Bilirubin 0.7 0.3 - 1.2 mg/dL   GFR, Estimated 59 (L) >60 mL/min    Comment: (NOTE) Calculated using the CKD-EPI Creatinine Equation (2021)    Anion gap 13 5 - 15    Comment: Performed at [facility], [address], [place], [place] [address]  Ethanol     Status: None   Collection Time: 12/06/22  5:55 PM  Result Value Ref Range   Alcohol, Ethyl (B) <10 <10 mg/dL    Comment: (NOTE) Lowest detectable limit for serum alcohol is 10 mg/dL.  For medical purposes only. Performed at [facility], [address] [place], [place] [address]   CBG monitoring, ED     Status: None   Collection Time: 12/06/22  5:57 PM  Result Value Ref Range   Glucose-Capillary 88 70 - 99 mg/dL    Comment: Glucose reference range applies only to samples taken after fasting for at least 8 hours.  I-stat chem 8, ed  Status: Abnormal   Collection Time: 12/06/22  6:15 PM  Result Value Ref Range   Sodium 138 135 - 145 mmol/L   Potassium 3.2 (L)  3.5 - 5.1 mmol/L   Chloride 100 98 - 111 mmol/L   BUN 16 8 - 23 mg/dL   Creatinine, Ser 4.09 0.44 - 1.00 mg/dL   Glucose, Bld 811 (H) 70 - 99 mg/dL    Comment: Glucose reference range applies only to samples taken after fasting for at least 8 hours.   Calcium, Ion 1.17 1.15 - 1.40 mmol/L   TCO2 27 22 - 32 mmol/L   Hemoglobin 11.6 (L) 12.0 - 15.0 g/dL   HCT 91.4 (L) 78.2 - 95.6 %  I-stat chem 8, ED     Status: Abnormal   Collection Time: 12/06/22  6:20 PM  Result Value Ref Range   Sodium 138 135 - 145 mmol/L   Potassium 3.2 (L) 3.5 - 5.1 mmol/L   Chloride 100 98 - 111 mmol/L   BUN 16 8 - 23 mg/dL   Creatinine, Ser 2.13 0.44 - 1.00 mg/dL   Glucose, Bld 086 (H) 70 - 99 mg/dL    Comment: Glucose reference range applies only to samples taken after fasting for at least 8 hours.   Calcium, Ion 1.16 1.15 - 1.40 mmol/L   TCO2 27 22 - 32 mmol/L   Hemoglobin 11.6 (L) 12.0 - 15.0 g/dL   HCT 57.8 (L) 46.9 - 62.9 %  Sedimentation rate     Status: Abnormal   Collection Time: 12/06/22  9:34 PM  Result Value Ref Range   Sed Rate 114 (H) 0 - 22 mm/hr    Comment: Performed at [facility], [address], [place], [place] [address]  C-reactive protein     Status: Abnormal   Collection Time: 12/06/22  9:34 PM  Result Value Ref Range   CRP 14.1 (H) <1.0 mg/dL    Comment: Performed at [facility], [address] [place], [place] [address]  CBC     Status: Abnormal   Collection Time: 12/07/22  4:37 AM  Result Value Ref Range   WBC 9.9 4.0 - 10.5 K/uL   RBC 3.80 (L) 3.87 - 5.11 MIL/uL   Hemoglobin 11.4 (L) 12.0 - 15.0 g/dL   HCT 10.2 (L) 72.5 - 36.6 %   MCV 88.4 80.0 - 100.0 fL   MCH 30.0 26.0 - 34.0 pg   MCHC 33.9 30.0 - 36.0 g/dL   RDW 44.0 34.7 - 42.5 %   Platelets 328 150 - 400 K/uL   nRBC 0.0 0.0 - 0.2 %    Comment: Performed at [facility], [address], [place], [place] [address]  Basic metabolic panel     Status: Abnormal   Collection Time: 12/07/22  4:37 AM   Result Value Ref Range   Sodium 135 135 - 145 mmol/L   Potassium 3.3 (L) 3.5 - 5.1 mmol/L   Chloride 100 98 - 111 mmol/L   CO2 24 22 - 32 mmol/L   Glucose, Bld 160 (H) 70 - 99 mg/dL    Comment: Glucose reference range applies only to samples taken after fasting for at least 8 hours.   BUN 14 8 - 23 mg/dL   Creatinine, Ser 7.56 0.44 - 1.00 mg/dL   Calcium 9.0 8.9 - 43.3 mg/dL   GFR, Estimated >29 >51 mL/min    Comment: (NOTE) Calculated using the CKD-EPI Creatinine Equation (2021)  Anion gap 11 5 - 15    Comment: Performed at [facility], [address], [place], [place] [address]  Magnesium     Status: Abnormal   Collection Time: 12/07/22  4:37 AM  Result Value Ref Range   Magnesium 1.6 (L) 1.7 - 2.4 mg/dL    Comment: Performed at [facility], [address] [place], [place] [address]  Phosphorus     Status: None   Collection Time: 12/07/22  4:37 AM  Result Value Ref Range   Phosphorus 3.3 2.5 - 4.6 mg/dL    Comment: Performed at [facility], [address], [place], [place] [address]  Lipid panel     Status: Abnormal   Collection Time: 12/07/22  4:37 AM  Result Value Ref Range   Cholesterol 160 0 - 200 mg/dL   Triglycerides 83 <213 mg/dL   HDL 32 (L) >08 mg/dL   Total CHOL/HDL Ratio 5.0 RATIO   VLDL 17 0 - 40 mg/dL   LDL Cholesterol 657 (H) 0 - 99 mg/dL    Comment:        Total Cholesterol/HDL:CHD Risk Coronary Heart Disease Risk Table                     Men   Women  1/2 Average Risk   3.4   3.3  Average Risk       5.0   4.4  2 X Average Risk   9.6   7.1  3 X Average Risk  23.4   11.0        Use the calculated Patient Ratio above and the CHD Risk Table to determine the patient's CHD Risk.        ATP III CLASSIFICATION (LDL):  <100     mg/dL   Optimal  846-962  mg/dL   Near or Above                    Optimal  130-159  mg/dL   Borderline  952-841  mg/dL   High  >324     mg/dL   Very High Performed at [facility], [address] [place], [place] [address]    MR BRAIN WO CONTRAST  Result Date: 12/07/2022 CLINICAL DATA:  Stroke suspected EXAM: MRI HEAD WITHOUT CONTRAST TECHNIQUE: Multiplanar, multiecho pulse sequences of the brain and surrounding structures were obtained without intravenous contrast. COMPARISON:  No prior MRI available, correlation is made with CT head 12/06/2022 FINDINGS: Brain: No restricted diffusion to suggest acute or subacute infarct. No acute hemorrhage, mass, mass effect, or midline shift. No hydrocephalus or extra-axial collection. Normal pituitary and craniocervical junction. No hemosiderin deposition to suggest remote hemorrhage. Normal cerebral volume for age. Scattered and confluent T2 hyperintense signal in the periventricular white matter, likely the sequela of mild-to-moderate chronic small vessel ischemic disease. Vascular: Normal arterial flow voids. Skull and upper cervical spine: Normal marrow signal. Sinuses/Orbits: Clear paranasal sinuses. No acute finding in the orbits. Status post bilateral lens replacements. Other: Fluid in the right-greater-than-left mastoid air cells. IMPRESSION: No acute intracranial process. No evidence of acute or subacute infarct. Electronically Signed   By: Wiliam Ke M.D.   On: 12/07/2022 03:45   CT ANGIO HEAD NECK W WO CM  Result Date: 12/07/2022 CLINICAL DATA:  Monocular vision loss EXAM: CT ANGIOGRAPHY HEAD AND NECK WITH AND WITHOUT CONTRAST TECHNIQUE: Multidetector CT imaging of the head and neck was performed using  the standard protocol during bolus administration of intravenous contrast. Multiplanar CT image reconstructions and MIPs were obtained to evaluate the vascular anatomy. Carotid stenosis measurements (when applicable) are obtained utilizing NASCET criteria, using the distal internal carotid diameter as the denominator. RADIATION DOSE REDUCTION: This exam was performed according to the departmental dose-optimization program which includes automated  exposure control, adjustment of the mA and/or kV according to patient size and/or use of iterative reconstruction technique. CONTRAST:  75mL OMNIPAQUE IOHEXOL 350 MG/ML SOLN COMPARISON:  No prior CTA available, correlation is made with 12/06/2022 CT head FINDINGS: CT HEAD FINDINGS For noncontrast findings, please see same day CT head. CTA NECK FINDINGS Aortic arch: Standard branching. Imaged portion shows no evidence of aneurysm or dissection. No significant stenosis of the major arch vessel origins. Mild aortic atherosclerosis. Right carotid system: No evidence of dissection, occlusion, or hemodynamically significant stenosis (greater than 50%). Left carotid system: No evidence of dissection, occlusion, or hemodynamically significant stenosis (greater than 50%). Vertebral arteries: No evidence of dissection, occlusion, or hemodynamically significant stenosis (greater than 50%). Skeleton: No acute osseous abnormality. Degenerative changes in the cervical spine. 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 No acute finding. Upper chest: No focal pulmonary opacity or pleural effusion. Review of the MIP images confirms the above findings CTA HEAD FINDINGS Anterior circulation: Both internal carotid arteries are patent to the termini, with scattered calcifications but without significant stenosis. A1 segments patent. Normal anterior communicating artery. Anterior cerebral arteries are patent to their distal aspects without significant stenosis. No M1 stenosis or occlusion. MCA branches perfused to their distal aspects without significant stenosis. Posterior circulation: Vertebral arteries patent to the vertebrobasilar junction without significant stenosis. Posterior inferior cerebellar arteries patent proximally. Basilar patent to its distal aspect without significant stenosis. Superior cerebellar arteries patent proximally. Patent P1 segments. PCAs perfused to their distal aspects without significant  stenosis. The bilateral posterior communicating arteries are not visualized. Venous sinuses: Not well opacified due to phase of timing. Anatomic variants: None significant. Review of the MIP images confirms the above findings IMPRESSION: 1. No intracranial large vessel occlusion or significant stenosis. 2. No hemodynamically significant stenosis in the neck. 3. Aortic atherosclerosis. Aortic Atherosclerosis (ICD10-I70.0). Electronically Signed   By: Wiliam Ke M.D.   On: 12/07/2022 00:53   CT HEAD WO CONTRAST  Result Date: 12/06/2022 CLINICAL DATA:  Neuro deficit, acute, stroke suspected EXAM: CT HEAD WITHOUT CONTRAST TECHNIQUE: Contiguous axial images were obtained from the base of the skull through the vertex without intravenous contrast. RADIATION DOSE REDUCTION: This exam was performed according to the departmental dose-optimization program which includes automated exposure control, adjustment of the mA and/or kV according to patient size and/or use of iterative reconstruction technique. COMPARISON:  None Available. FINDINGS: Brain: There is atrophy and chronic small vessel disease changes. No acute intracranial abnormality. Specifically, no hemorrhage, hydrocephalus, mass lesion, acute infarction, or significant intracranial injury. Vascular: No hyperdense vessel or unexpected calcification. Skull: No acute calvarial abnormality. Sinuses/Orbits: No acute findings Other: None IMPRESSION: Atrophy, chronic microvascular disease. No acute intracranial abnormality. Electronically Signed   By: Charlett Nose M.D.   On: 12/06/2022 19:14    Pending Labs Unresulted Labs (From admission, onward)    None       Vitals/Pain Today's Vitals   12/07/22 0700 12/07/22 0800 12/07/22 1006 12/07/22 1228  BP: (!) 114/57 128/70  (!) 129/59  Pulse: 81 61  67  Resp: 20 17  18   Temp:   98.4 F (36.9  C) 98.2 F (36.8 C)  TempSrc:   Oral Oral  SpO2: 96% 99%  95%  Weight:      Height:      PainSc:         Isolation Precautions No active isolations  Medications Medications  sodium chloride flush (NS) 0.9 % injection 3 mL (3 mLs Intravenous Not Given 12/07/22 0021)  pantoprazole (PROTONIX) EC tablet 40 mg (40 mg Oral Given 12/07/22 0918)  atorvastatin (LIPITOR) tablet 80 mg (80 mg Oral Not Given 12/07/22 0917)  methylPREDNISolone sodium succinate (SOLU-MEDROL) 1,000 mg in sodium chloride 0.9 % 50 mL IVPB (has no administration in time range)  acetaminophen (TYLENOL) tablet 650 mg (has no administration in time range)  prochlorperazine (COMPAZINE) injection 5 mg (has no administration in time range)  polyethylene glycol (MIRALAX / GLYCOLAX) packet 17 g (has no administration in time range)  melatonin tablet 5 mg (has no administration in time range)  0.9 %  sodium chloride infusion ( Intravenous New Bag/Given 12/07/22 0500)  potassium chloride SA (KLOR-CON M) CR tablet 20 mEq (has no administration in time range)  iohexol (OMNIPAQUE) 350 MG/ML injection 75 mL (75 mLs Intravenous Contrast Given 12/06/22 2321)  methylPREDNISolone sodium succinate (SOLU-MEDROL) 1,000 mg in sodium chloride 0.9 % 50 mL IVPB (0 mg Intravenous Stopped 12/07/22 0120)  potassium chloride SA (KLOR-CON M) CR tablet 40 mEq (40 mEq Oral Given 12/07/22 0145)  potassium chloride SA (KLOR-CON M) CR tablet 40 mEq (40 mEq Oral Given 12/07/22 1047)    Mobility walks with person assist     Focused Assessments Neuro Assessment Handoff:  Swallow screen pass? Yes  Cardiac Rhythm: Normal sinus rhythm NIH Stroke Scale  Dizziness Present: No Headache Present: No Interval: Shift assessment Level of Consciousness (1a.)   : Alert, keenly responsive LOC Questions (1b. )   : Answers both questions correctly LOC Commands (1c. )   : Performs both tasks correctly Best Gaze (2. )  : Normal Visual (3. )  : No visual loss Facial Palsy (4. )    : Normal symmetrical movements Motor Arm, Left (5a. )   : No drift Motor Arm, Right (5b. ) :  No drift Motor Leg, Left (6a. )  : No drift Motor Leg, Right (6b. ) : No drift Limb Ataxia (7. ): Absent Sensory (8. )  : Normal, no sensory loss Best Language (9. )  : No aphasia Dysarthria (10. ): Normal Extinction/Inattention (11.)   : No Abnormality Complete NIHSS TOTAL: 0 Last date known well: 12/06/22 Last time known well: 0800 Neuro Assessment: Within Defined Limits Neuro Checks:   Initial (12/06/22 1746)  Has TPA been given? No If patient is a Neuro Trauma and patient is going to OR before floor call report to 4N Charge nurse: [phone] or [phone]   R Recommendations: See Admitting Provider Note  Report given to:   Additional Notes: All symptoms have resolved. Hasn't had any more vision loss or blurriness since yesterday.

## 2022-12-07 NOTE — Consult Note (Signed)
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  She has left-sided headache, tenderness over the temporal artery, jaw discomfort with chewing, and visual changes.  She has been started on steroids.  I counseled her about the utility of a temporal artery biopsy-both positive and negative results can help her workup and guide therapy.  Unfortunately, we have no OR availability until early next week.  We will arrange her biopsy as an outpatient as soon as the schedule allows.  CHIEF COMPLAINT: concern for temporal arteritis  HISTORY OF PRESENT ILLNESS: Sara Boyer is a 82 y.o. female admitted to the internal medicine service for visual changes and left-sided facial pain since 12/02/2022.  She also reports a posterior headache.  She reports jaw claudication.  Past Medical History:  Diagnosis Date   Anemia    hx of as a child    Hypertension    Pneumonia    hx of    Shortness of breath    with exertion    Past Surgical History:  Procedure Laterality Date   ABDOMINAL HYSTERECTOMY     APPENDECTOMY     BREAST CYST EXCISION Right    BREAST SURGERY     right breast surgery for benign lump    corrective surgery to open fallopian tubes      DILATION AND CURETTAGE OF UTERUS     several    HEMORRHOID SURGERY     lobe removed from thyroid      SHOULDER OPEN ROTATOR CUFF REPAIR  01/24/2012   Procedure: ROTATOR CUFF REPAIR SHOULDER OPEN;  Surgeon: Jacki Cones, MD;  Location: [facility];  Service: Orthopedics;  Laterality: Left;  Left Shoulder Rotator Cuff Repair with Graft and Anchors and acrominectomy   TONSILLECTOMY      Family History  Problem Relation Age of Onset   Breast cancer Cousin 38       again @ 61    Social History   Socioeconomic History   Marital status: Married    Spouse name: Not on file   Number of children: Not on file   Years of education: Not on file   Highest education level: Not on file   Occupational History   Not on file  Tobacco Use   Smoking status: Never   Smokeless tobacco: Never  Substance and Sexual Activity   Alcohol use: Yes    Comment: occasional glass of wine or cocktail    Drug use: No   Sexual activity: Not on file  Other Topics Concern   Not on file  Social History Narrative   Not on file   Social Determinants of Health   Financial Resource Strain: Not on file  Food Insecurity: Not on file  Transportation Needs: Not on file  Physical Activity: Not on file  Stress: Not on file  Social Connections: Not on file  Intimate Partner Violence: Not on file    Allergies  Allergen Reactions   Amlodipine Besylate Other (See Comments)   Lisinopril Other (See Comments)   Tetanus Toxoids    Strawberry Extract Rash and Other (See Comments)    burning when touched on skin    Current Facility-Administered Medications  Medication Dose Route Frequency Provider Last Rate Last Admin   0.9 %  sodium chloride infusion   Intravenous Continuous Rolly Salter, MD       acetaminophen (TYLENOL) tablet 650 mg  650 mg Oral Q6H PRN Darlin Drop, DO  atorvastatin (LIPITOR) tablet 80 mg  80 mg Oral Daily Hall, Carole N, DO       melatonin tablet 5 mg  5 mg Oral QHS PRN Dow Adolph N, DO       methylPREDNISolone sodium succinate (SOLU-MEDROL) 1,000 mg in sodium chloride 0.9 % 50 mL IVPB  1,000 mg Intravenous Daily Hall, Carole N, DO       pantoprazole (PROTONIX) EC tablet 40 mg  40 mg Oral Daily Dow Adolph N, DO   40 mg at 12/07/22 6962   polyethylene glycol (MIRALAX / GLYCOLAX) packet 17 g  17 g Oral Daily PRN Darlin Drop, DO       [START ON 12/08/2022] potassium chloride SA (KLOR-CON M) CR tablet 20 mEq  20 mEq Oral BID Rolly Salter, MD       prochlorperazine (COMPAZINE) injection 5 mg  5 mg Intravenous Q6H PRN Dow Adolph N, DO        PHYSICAL EXAM Vitals:   12/07/22 0800 12/07/22 1006 12/07/22 1228 12/07/22 1450  BP: 128/70  (!) 129/59   Pulse:  61  67   Resp: 17  18   Temp:  98.4 F (36.9 C) 98.2 F (36.8 C) (!) 97.4 F (36.3 C)  TempSrc:  Oral Oral Oral  SpO2: 99%  95%   Weight:      Height:       Well-appearing elderly woman in no distress Regular rate and rhythm Unlabored breathing Palpable left temporal artery which is mildly tender to palpation    PERTINENT LABORATORY AND RADIOLOGIC DATA  Most recent CBC    Latest Ref Rng & Units 12/07/2022    4:37 AM 12/06/2022    6:20 PM 12/06/2022    6:15 PM  CBC  WBC 4.0 - 10.5 K/uL 9.9     Hemoglobin 12.0 - 15.0 g/dL 95.2  84.1  32.4   Hematocrit 36.0 - 46.0 % 33.6  34.0  34.0   Platelets 150 - 400 K/uL 328        Most recent CMP    Latest Ref Rng & Units 12/07/2022    4:37 AM 12/06/2022    6:20 PM 12/06/2022    6:15 PM  CMP  Glucose 70 - 99 mg/dL 401  027  253   BUN 8 - 23 mg/dL 14  16  16    Creatinine 0.44 - 1.00 mg/dL 6.64  4.03  4.74   Sodium 135 - 145 mmol/L 135  138  138   Potassium 3.5 - 5.1 mmol/L 3.3  3.2  3.2   Chloride 98 - 111 mmol/L 100  100  100   CO2 22 - 32 mmol/L 24     Calcium 8.9 - 10.3 mg/dL 9.0       Renal function Estimated Creatinine Clearance: 41.6 mL/min (by C-G formula based on SCr of 0.93 mg/dL).  No results found for: "HGBA1C"  LDL Cholesterol  Date Value Ref Range Status  12/07/2022 111 (H) 0 - 99 mg/dL Final    Comment:           Total Cholesterol/HDL:CHD Risk Coronary Heart Disease Risk Table                     Men   Women  1/2 Average Risk   3.4   3.3  Average Risk       5.0   4.4  2 X Average Risk   9.6   7.1  3 X  Average Risk  23.4   11.0        Use the calculated Patient Ratio above and the CHD Risk Table to determine the patient's CHD Risk.        ATP III CLASSIFICATION (LDL):  <100     mg/dL   Optimal  130-865  mg/dL   Near or Above                    Optimal  130-159  mg/dL   Borderline  784-696  mg/dL   High  >295     mg/dL   Very High Performed at [facility], [address], [place],  [place] [address]     Rande Brunt. Lenell Antu, MD FACS [facility] Phone Number: [phone] 12/07/2022 3:39 PM   Total time spent on preparing this encounter including chart review, data review, collecting history, examining the patient, coordinating care for this new patient, 60 minutes.  Portions of this report may have been transcribed using voice recognition software.  Every effort has been made to ensure accuracy; however, inadvertent computerized transcription errors may still be present.

## 2022-12-07 NOTE — ED Notes (Signed)
Pt to MRI

## 2022-12-07 NOTE — Hospital Course (Addendum)
Brief hospital course: Patient with PMH of HTN presented to the hospital with complaints of vision changes and left sided facial pain ongoing since 7/6. Recently seen in the PCPs office for hypotension which is ongoing since April 2024.  Patient was asked to stop taking her blood pressure medications. Also was seen for upper respiratory infection symptoms and was started on doxycycline for bronchitis on 7/8. Presents with sudden onset of painless visual changes on the left eye with difficulty with colors for 1 day prior to admission. Neurology was consulted.  Suspecting temporal arteritis.  Assessment and Plan: Temporal arteritis. Blurry vision/loss of color clarity. Left-sided headache. Presents with 1 day onset of vision issues particularly related to colors. Also reports sensitivity with pain on left side of her face. CT scan of the head as well as MRI brain negative for any acute abnormality. CT angiography head and neck also negative for any large vessel occlusion. ESR 114.  CRP 14.1.  Patient does not have any other acute inflammatory processes. Patient was given IV steroids with vision returning back to her normal self for now. Vascular surgery is consulted. Ophthalmology consulted as well. No other focal deficit. Aspirin on hold for now. Will continue IV steroids today and transition to p.o. with a prolonged taper. PT OT consulted.  Bronchitis. Patient was on doxycycline. While I am not convinced that the patient actually has any pneumonia or significant evidence of bacterial infection which I will resume antibiotic for a short course.  Hypertension with recent hypotension. Patient has been having history of hypertension and has been on blood pressure medication. Recently in April started to have low blood pressure with frequent dizziness episodes. Medication for her blood pressure has been discontinued now. Monitor.  Hypokalemia. Currently being  replaced. Monitor.  Normocytic anemia. H&H relatively stable. Active bleeding. Will monitor.  Obesity Body mass index is 30.03 kg/m.  Placing the pt at higher risk of poor outcomes.

## 2022-12-07 NOTE — ED Notes (Signed)
Pt back from MRI 

## 2022-12-07 NOTE — Evaluation (Signed)
Occupational Therapy Evaluation Patient Details Name: Sara Boyer MRN: [id number] DOB: 1940-11-30 Today's Date: 12/07/2022   History of Present Illness Patient with PMH of HTN presented to the hospital with complaints of vision changes and left sided facial pain ongoing since 7/6.  CT scan and MRI negative for changes.  PMH of HTN, anemia, and PNA.  Past knee surgery and rotator cuff surgery in 2013.   Clinical Impression   Pt currently at min assist level for functional transfers without use of an assistive device.  Min guard to supervision with use of the RW for support.  No visual deficits noted with testing as they have resolved.  BP stable throughout at 133/76 and then at 123/67 in sitting.  Oxygen sats 100% on room air with HR in the mid to upper 70s.  She lives with her spouse who can provide 24 hour supervision.  Feel she will benefit from acute care OT at this time to help progress back to modified independent level.  No post acute OT recommended      Recommendations for follow up therapy are one component of a multi-disciplinary discharge planning process, led by the attending physician.  Recommendations may be updated based on patient status, additional functional criteria and insurance authorization.   Assistance Recommended at Discharge Intermittent Supervision/Assistance     Functional Status Assessment  Patient has had a recent decline in their functional status and demonstrates the ability to make significant improvements in function in a reasonable and predictable amount of time.  Equipment Recommendations  None recommended by OT       Precautions / Restrictions Precautions Precautions: Fall Restrictions Weight Bearing Restrictions: No      Mobility Bed Mobility Overal bed mobility: Needs Assistance Bed Mobility: Supine to Sit     Supine to sit: Min assist, HOB elevated     General bed mobility comments: To bring trunk up to sitting on the stretcher.     Transfers Overall transfer level: Needs assistance Equipment used: None Transfers: Sit to/from Stand, Bed to chair/wheelchair/BSC Sit to Stand: Supervision     Step pivot transfers: Min assist     General transfer comment: Initially needed min assist for balance without assistive device.  Supervision to in guard with use of the RW      Balance Overall balance assessment: Needs assistance Sitting-balance support: Feet supported Sitting balance-Leahy Scale: Good     Standing balance support: No upper extremity supported, During functional activity Standing balance-Leahy Scale: Fair Standing balance comment: Needs at least unilateral UE support with mobility and standing.                           ADL either performed or assessed with clinical judgement   ADL Overall ADL's : Needs assistance/impaired Eating/Feeding: Independent;Sitting   Grooming: Wash/dry hands;Wash/dry face;Supervision/safety;Standing Grooming Details (indicate cue type and reason): simulated Upper Body Bathing: Set up;Sitting Upper Body Bathing Details (indicate cue type and reason): simulated Lower Body Bathing: Supervison/ safety;Sit to/from stand Lower Body Bathing Details (indicate cue type and reason): simulated Upper Body Dressing : Set up   Lower Body Dressing: Supervision/safety;Sit to/from stand Lower Body Dressing Details (indicate cue type and reason): simulated Toilet Transfer: Minimal assistance;Ambulation Toilet Transfer Details (indicate cue type and reason): no assistive device Toileting- Clothing Manipulation and Hygiene: Supervision/safety;Sit to/from stand Toileting - Clothing Manipulation Details (indicate cue type and reason): simulated     Functional mobility during ADLs: Minimal assistance (no  assistive device) General ADL Comments: Pt with resolved visual changes at time of eval.  Slight decreased dynamic standing balance with functional transfers.  Recommend  initial use of the RW for safety and use of a shower seat which she already has and was using.     Vision Baseline Vision/History: 0 No visual deficits Ability to See in Adequate Light: 0 Adequate Patient Visual Report: Blurring of vision (in the left eye reports episoded of impaired vision, seeing white washed walls, and x-ray like pics when looking at people.  Resolved at this time.) Vision Assessment?: No apparent visual deficits     Perception  [facility]   Praxis  [facility]    Pertinent Vitals/Pain Pain Assessment Pain Assessment: No/denies pain     Hand Dominance Right   Extremity/Trunk Assessment Upper Extremity Assessment Upper Extremity Assessment: Overall WFL for tasks assessed   Lower Extremity Assessment Lower Extremity Assessment: Defer to PT evaluation   Cervical / Trunk Assessment Cervical / Trunk Assessment: Normal   Communication Communication Communication: No difficulties   Cognition Arousal/Alertness: Awake/alert Behavior During Therapy: WFL for tasks assessed/performed Overall Cognitive Status: Within Functional Limits for tasks assessed                                 General Comments: Able to recall 2/3 words after 5 min delay.  Oriented to place, time, and situation.                Home Living Family/patient expects to be discharged to:: Private residence Living Arrangements: Spouse/significant other Available Help at Discharge: Available 24 hours/day Type of Home: House Home Access: Stairs to enter [employer] of Steps: 4-5 Entrance Stairs-Rails: Right Home Layout: Two level;Able to live on main level with bedroom/bathroom (She has a basement they go into for a second refrigerator, freezer, tools.  Also has second floor but they don't go up unless needed.)     Bathroom Shower/Tub: Walk-in shower;Door   [employer] Toilet: Standard Bathroom Accessibility: Yes   Home Equipment: Midwife (2  wheels)   Additional Comments: Pt does cooking, cleaning, gardening      Prior Functioning/Environment Prior Level of Function : Independent/Modified Independent                        OT Problem List: Impaired balance (sitting and/or standing)      OT Treatment/Interventions: Self-care/ADL training;Patient/family education;Balance training;Therapeutic activities;DME and/or AE instruction    OT Goals(Current goals can be found in the care plan section) Acute Rehab OT Goals Patient Stated Goal: To figure out what has caused her vision to change and then come back OT Goal Formulation: With patient Time For Goal Achievement: 12/21/22 Potential to Achieve Goals: Good  OT Frequency: Min 1X/week       AM-PAC OT "6 Clicks" Daily Activity     Outcome Measure Help from another person eating meals?: None Help from another person taking care of personal grooming?: A Little Help from another person toileting, which includes using toliet, bedpan, or urinal?: A Little Help from another person bathing (including washing, rinsing, drying)?: A Little Help from another person to put on and taking off regular upper body clothing?: None Help from another person to put on and taking off regular lower body clothing?: A Little 6 Click Score: 20   End of Session Equipment Utilized During Treatment: Gait belt;Rolling walker (2 wheels) Nurse Communication:  Mobility status  Activity Tolerance: Patient tolerated treatment well Patient left: Other (comment) (left with PT for next session)  OT Visit Diagnosis: Unsteadiness on feet (R26.81)                Time: 1610-9604 OT Time Calculation (min): 29 min Charges:  OT General Charges $OT Visit: 1 Visit OT Evaluation $OT Eval Moderate Complexity: 1 Mod OT Treatments $Self Care/Home Management : 8-22 mins  Perrin Maltese, OTR/L Acute Rehabilitation Services  Office [phone] 12/07/2022

## 2022-12-07 NOTE — Evaluation (Addendum)
Physical Therapy Evaluation Patient Details Name: Sara Boyer MRN: [id number] DOB: 10/27/1940 Today's Date: 12/07/2022  History of Present Illness  Patient with  presented to the hospital with complaints of vision changes and left sided facial pain ongoing since 7/6.  CT scan and MRI negative for changes.  PMH of HTN, anemia, and PNA.  Past knee surgery and rotator cuff surgery in 2013.  Clinical Impression  Pt admitted with above diagnosis. Pt able to ambulate with RW with good safety awareness overall. Pt encouraged to use RW for safety intiailly when returns home. Scored 18/24 on DGI suggesting high risk of falls without device. Outpt PT recommended for balance program. Pt agrees.  Pt currently with functional limitations due to the deficits listed below (see PT Problem List). Pt will benefit from acute skilled PT to increase their independence and safety with mobility to allow discharge.           Assistance Recommended at Discharge PRN  If plan is discharge home, recommend the following:  Can travel by private vehicle           Equipment Recommendations None recommended by PT  Recommendations for Other Services       Functional Status Assessment Patient has had a recent decline in their functional status and demonstrates the ability to make significant improvements in function in a reasonable and predictable amount of time.     Precautions / Restrictions Precautions Precautions: Fall Restrictions Weight Bearing Restrictions: No      Mobility  Bed Mobility Overal bed mobility: Needs Assistance Bed Mobility: Supine to Sit     Supine to sit: Min assist, HOB elevated     General bed mobility comments: To bring trunk up to sitting on the stretcher.    Transfers Overall transfer level: Needs assistance Equipment used: None Transfers: Sit to/from Stand, Bed to chair/wheelchair/BSC Sit to Stand: Supervision   Step pivot transfers: Min assist       General  transfer comment: Initially needed min assist for balance without assistive device.  Supervision to in guard with use of the RW    Ambulation/Gait Ambulation/Gait assistance: Min assist Gait Distance (Feet): 250 Feet Assistive device: Rolling walker (2 wheels), None Gait Pattern/deviations: Step-through pattern, Decreased stride length   Gait velocity interpretation: 1.31 - 2.62 ft/sec, indicative of limited community ambulator   General Gait Details: Pt unsteady at times without RW losing balance and needing min assist to recover.Pt was supervision with use of RW.  Stairs            Wheelchair Mobility     Tilt Bed    Modified Rankin (Stroke Patients Only)       Balance Overall balance assessment: Needs assistance Sitting-balance support: Feet supported Sitting balance-Leahy Scale: Good     Standing balance support: No upper extremity supported, During functional activity Standing balance-Leahy Scale: Fair Standing balance comment: Needs at least unilateral UE support with mobility and standing.                 Standardized Balance Assessment Standardized Balance Assessment : Dynamic Gait Index   Dynamic Gait Index Level Surface: Mild Impairment Change in Gait Speed: Mild Impairment Gait with Horizontal Head Turns: Mild Impairment Gait with Vertical Head Turns: Normal Gait and Pivot Turn: Mild Impairment Step Over Obstacle: Mild Impairment Step Around Obstacles: Normal Steps: Mild Impairment Total Score: 18       Pertinent Vitals/Pain Pain Assessment Pain Assessment: No/denies pain    Home Living  Family/patient expects to be discharged to:: Private residence Living Arrangements: Spouse/significant other Available Help at Discharge: Available 24 hours/day Type of Home: House Home Access: Stairs to enter Entrance Stairs-Rails: Right Entrance Stairs-Number of Steps: 4-5   Home Layout: Two level;Able to live on main level with bedroom/bathroom  (She has a basement they go into for a second refrigerator, freezer, tools.  Also has second floor but they don't go up unless needed.) Home Equipment: BSC/3in1;Shower seat;Rolling Walker (2 wheels) Additional Comments: Pt does cooking, cleaning, gardening    Prior Function Prior Level of Function : Independent/Modified Independent                     Hand Dominance   Dominant Hand: Right    Extremity/Trunk Assessment   Upper Extremity Assessment Upper Extremity Assessment: Defer to OT evaluation    Lower Extremity Assessment Lower Extremity Assessment: Overall WFL for tasks assessed    Cervical / Trunk Assessment Cervical / Trunk Assessment: Normal  Communication   Communication: No difficulties  Cognition Arousal/Alertness: Awake/alert Behavior During Therapy: WFL for tasks assessed/performed Overall Cognitive Status: Within Functional Limits for tasks assessed                                 General Comments: Able to recall 2/3 words after 5 min delay.  Oriented to place, time, and situation.        General Comments General comments (skin integrity, edema, etc.): VSS    Exercises     Assessment/Plan    PT Assessment Patient needs continued PT services  PT Problem List Decreased activity tolerance;Decreased balance;Decreased mobility;Decreased knowledge of use of DME;Decreased safety awareness;Decreased knowledge of precautions;Cardiopulmonary status limiting activity       PT Treatment Interventions DME instruction;Gait training;Functional mobility training;Therapeutic activities;Therapeutic exercise;Balance training;Patient/family education    PT Goals (Current goals can be found in the Care Plan section)  Acute Rehab PT Goals Patient Stated Goal: to go home PT Goal Formulation: With patient Time For Goal Achievement: 12/21/22 Potential to Achieve Goals: Good    Frequency Min 3X/week     Co-evaluation               AM-PAC  PT "6 Clicks" Mobility  Outcome Measure Help needed turning from your back to your side while in a flat bed without using bedrails?: A Little Help needed moving from lying on your back to sitting on the side of a flat bed without using bedrails?: A Little Help needed moving to and from a bed to a chair (including a wheelchair)?: A Little Help needed standing up from a chair using your arms (e.g., wheelchair or bedside chair)?: A Little Help needed to walk in hospital room?: A Little Help needed climbing 3-5 steps with a railing? : A Little 6 Click Score: 18    End of Session Equipment Utilized During Treatment: Gait belt Activity Tolerance: Patient tolerated treatment well Patient left: with call bell/phone within reach (on stretcher) Nurse Communication: Mobility status PT Visit Diagnosis: Unsteadiness on feet (R26.81);Muscle weakness (generalized) (M62.81)    Time: 1610-9604 PT Time Calculation (min) (ACUTE ONLY): 14 min   Charges:   PT Evaluation $PT Eval Moderate Complexity: 1 Mod   PT General Charges $$ ACUTE PT VISIT: 1 Visit         Angeliyah Kirkey M,PT Acute Rehab Services [phone]   Bevelyn Buckles 12/07/2022, 1:32 PM

## 2022-12-07 NOTE — Progress Notes (Signed)
   12/07/22 1039  PT Recommendation  Follow Up Recommendations Outpatient PT (balance program)  PT equipment None recommended by PT   Full note to follow.  REcommendations as above.   Paiton Fosco M,PT Acute Rehab Services [phone]

## 2022-12-07 NOTE — Progress Notes (Signed)
STROKE TEAM PROGRESS NOTE   SUBJECTIVE (INTERVAL HISTORY) Her husband is at the bedside.  Patient reclining in bed, finished lunch.  Stated that she felt better, less soreness in the scalp area, less pain in the back of hand.  Able to eat better without the jaw claudication.  No more left eye vision changes.  Pending temporal artery ultrasound.  VVS consulted for temporal artery biopsy, however, not able to be done this week.   OBJECTIVE Temp:  [97.4 F (36.3 C)-98.4 F (36.9 C)] 97.4 F (36.3 C) (07/11 1450) Pulse Rate:  [61-81] 67 (07/11 1228) Cardiac Rhythm: Normal sinus rhythm (07/11 1452) Resp:  [11-22] 18 (07/11 1228) BP: (111-140)/(54-75) 129/59 (07/11 1228) SpO2:  [93 %-100 %] 95 % (07/11 1228)  Recent Labs  Lab 12/06/22 1757  GLUCAP 88   Recent Labs  Lab 12/06/22 1755 12/06/22 1815 12/06/22 1820 12/07/22 0437  NA 138 138 138 135  K 3.2* 3.2* 3.2* 3.3*  CL 99 100 100 100  CO2 26  --   --  24  GLUCOSE 100* 101* 101* 160*  BUN 16 16 16 14   CREATININE 0.96 0.90 0.90 0.93  CALCIUM 9.3  --   --  9.0  MG  --   --   --  1.6*  PHOS  --   --   --  3.3   Recent Labs  Lab 12/06/22 1755  AST 35  ALT 50*  ALKPHOS 80  BILITOT 0.7  PROT 6.8  ALBUMIN 2.7*   Recent Labs  Lab 12/06/22 1755 12/06/22 1815 12/06/22 1820 12/07/22 0437  WBC 10.4  --   --  9.9  NEUTROABS 7.9*  --   --   --   HGB 11.3* 11.6* 11.6* 11.4*  HCT 34.4* 34.0* 34.0* 33.6*  MCV 88.2  --   --  88.4  PLT 374  --   --  328   No results for input(s): "CKTOTAL", "CKMB", "CKMBINDEX", "TROPONINI" in the last 168 hours. Recent Labs    12/06/22 1755  LABPROT 16.0*  INR 1.3*   No results for input(s): "COLORURINE", "LABSPEC", "PHURINE", "GLUCOSEU", "HGBUR", "BILIRUBINUR", "KETONESUR", "PROTEINUR", "UROBILINOGEN", "NITRITE", "LEUKOCYTESUR" in the last 72 hours.  Invalid input(s): "APPERANCEUR"     Component Value Date/Time   CHOL 160 12/07/2022 0437   TRIG 83 12/07/2022 0437   HDL 32 (L)  12/07/2022 0437   CHOLHDL 5.0 12/07/2022 0437   VLDL 17 12/07/2022 0437   LDLCALC 111 (H) 12/07/2022 0437   No results found for: "HGBA1C" No results found for: "LABOPIA", "COCAINSCRNUR", "LABBENZ", "AMPHETMU", "THCU", "LABBARB"  Recent Labs  Lab 12/06/22 1755  ETH <10    I have personally reviewed the radiological images below and agree with the radiology interpretations.  MR BRAIN WO CONTRAST  Result Date: 12/07/2022 CLINICAL DATA:  Stroke suspected EXAM: MRI HEAD WITHOUT CONTRAST TECHNIQUE: Multiplanar, multiecho pulse sequences of the brain and surrounding structures were obtained without intravenous contrast. COMPARISON:  No prior MRI available, correlation is made with CT head 12/06/2022 FINDINGS: Brain: No restricted diffusion to suggest acute or subacute infarct. No acute hemorrhage, mass, mass effect, or midline shift. No hydrocephalus or extra-axial collection. Normal pituitary and craniocervical junction. No hemosiderin deposition to suggest remote hemorrhage. Normal cerebral volume for age. Scattered and confluent T2 hyperintense signal in the periventricular white matter, likely the sequela of mild-to-moderate chronic small vessel ischemic disease. Vascular: Normal arterial flow voids. Skull and upper cervical spine: Normal marrow signal. Sinuses/Orbits: Clear paranasal sinuses. No  acute finding in the orbits. Status post bilateral lens replacements. Other: Fluid in the right-greater-than-left mastoid air cells. IMPRESSION: No acute intracranial process. No evidence of acute or subacute infarct. Electronically Signed   By: Wiliam Ke M.D.   On: 12/07/2022 03:45   CT ANGIO HEAD NECK W WO CM  Result Date: 12/07/2022 CLINICAL DATA:  Monocular vision loss EXAM: CT ANGIOGRAPHY HEAD AND NECK WITH AND WITHOUT CONTRAST TECHNIQUE: Multidetector CT imaging of the head and neck was performed using the standard protocol during bolus administration of intravenous contrast. Multiplanar CT  image reconstructions and MIPs were obtained to evaluate the vascular anatomy. Carotid stenosis measurements (when applicable) are obtained utilizing NASCET criteria, using the distal internal carotid diameter as the denominator. RADIATION DOSE REDUCTION: This exam was performed according to the departmental dose-optimization program which includes automated exposure control, adjustment of the mA and/or kV according to patient size and/or use of iterative reconstruction technique. CONTRAST:  75mL OMNIPAQUE IOHEXOL 350 MG/ML SOLN COMPARISON:  No prior CTA available, correlation is made with 12/06/2022 CT head FINDINGS: CT HEAD FINDINGS For noncontrast findings, please see same day CT head. CTA NECK FINDINGS Aortic arch: Standard branching. Imaged portion shows no evidence of aneurysm or dissection. No significant stenosis of the major arch vessel origins. Mild aortic atherosclerosis. Right carotid system: No evidence of dissection, occlusion, or hemodynamically significant stenosis (greater than 50%). Left carotid system: No evidence of dissection, occlusion, or hemodynamically significant stenosis (greater than 50%). Vertebral arteries: No evidence of dissection, occlusion, or hemodynamically significant stenosis (greater than 50%). Skeleton: No acute osseous abnormality. Degenerative changes in the cervical spine. 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 No acute finding. Upper chest: No focal pulmonary opacity or pleural effusion. Review of the MIP images confirms the above findings CTA HEAD FINDINGS Anterior circulation: Both internal carotid arteries are patent to the termini, with scattered calcifications but without significant stenosis. A1 segments patent. Normal anterior communicating artery. Anterior cerebral arteries are patent to their distal aspects without significant stenosis. No M1 stenosis or occlusion. MCA branches perfused to their distal aspects without significant stenosis.  Posterior circulation: Vertebral arteries patent to the vertebrobasilar junction without significant stenosis. Posterior inferior cerebellar arteries patent proximally. Basilar patent to its distal aspect without significant stenosis. Superior cerebellar arteries patent proximally. Patent P1 segments. PCAs perfused to their distal aspects without significant stenosis. The bilateral posterior communicating arteries are not visualized. Venous sinuses: Not well opacified due to phase of timing. Anatomic variants: None significant. Review of the MIP images confirms the above findings IMPRESSION: 1. No intracranial large vessel occlusion or significant stenosis. 2. No hemodynamically significant stenosis in the neck. 3. Aortic atherosclerosis. Aortic Atherosclerosis (ICD10-I70.0). Electronically Signed   By: Wiliam Ke M.D.   On: 12/07/2022 00:53   CT HEAD WO CONTRAST  Result Date: 12/06/2022 CLINICAL DATA:  Neuro deficit, acute, stroke suspected EXAM: CT HEAD WITHOUT CONTRAST TECHNIQUE: Contiguous axial images were obtained from the base of the skull through the vertex without intravenous contrast. RADIATION DOSE REDUCTION: This exam was performed according to the departmental dose-optimization program which includes automated exposure control, adjustment of the mA and/or kV according to patient size and/or use of iterative reconstruction technique. COMPARISON:  None Available. FINDINGS: Brain: There is atrophy and chronic small vessel disease changes. No acute intracranial abnormality. Specifically, no hemorrhage, hydrocephalus, mass lesion, acute infarction, or significant intracranial injury. Vascular: No hyperdense vessel or unexpected calcification. Skull: No acute calvarial abnormality. Sinuses/Orbits: No acute findings  Other: None IMPRESSION: Atrophy, chronic microvascular disease. No acute intracranial abnormality. Electronically Signed   By: Charlett Nose M.D.   On: 12/06/2022 19:14   DG Chest 2  View  Result Date: 12/04/2022 CLINICAL DATA:  Cough and congestion. Weakness. Acute upper respiratory infection. EXAM: CHEST - 2 VIEW COMPARISON:  02/19/2013 FINDINGS: Heart size is normal. Mild aortic atherosclerosis is present. There is central bronchial thickening consistent with bronchitis. Mild patchy density in the lingula could be scarring or mild lingular pneumonia. No dense consolidation. No effusion. Chronic degenerative changes affect the spine. IMPRESSION: Bronchitis pattern. Mild patchy density in the lingula could be scarring or mild lingular pneumonia. Electronically Signed   By: Paulina Fusi M.D.   On: 12/04/2022 12:26     PHYSICAL EXAM  Temp:  [97.4 F (36.3 C)-98.4 F (36.9 C)] 97.4 F (36.3 C) (07/11 1450) Pulse Rate:  [61-81] 67 (07/11 1228) Resp:  [11-22] 18 (07/11 1228) BP: (111-140)/(54-75) 129/59 (07/11 1228) SpO2:  [93 %-100 %] 95 % (07/11 1228)  General - Well nourished, well developed, in no apparent distress.  Ophthalmologic - fundi not visualized due to noncooperation.  Cardiovascular - Regular rhythm and rate.  Mental Status -  Level of arousal and orientation to time, place, and person were intact. Language including expression, naming, repetition, comprehension was assessed and found intact. Fund of Knowledge was assessed and was intact.  Cranial Nerves II - XII - II - Visual field intact OU. III, IV, VI - Extraocular movements intact. V - Facial sensation intact bilaterally. VII - Facial movement intact bilaterally. VIII - Hearing & vestibular intact bilaterally. X - Palate elevates symmetrically. XI - Chin turning & shoulder shrug intact bilaterally. XII - Tongue protrusion intact.  Motor Strength - The patient's strength was normal in all extremities and pronator drift was absent.  Bulk was normal and fasciculations were absent.   Motor Tone - Muscle tone was assessed at the neck and appendages and was normal.  Reflexes - The patient's  reflexes were symmetrical in all extremities and she had no pathological reflexes.  Sensory - Light touch, temperature/pinprick were assessed and were symmetrical.    Coordination - The patient had normal movements in the hands and feet with no ataxia or dysmetria.  Tremor was absent.  Gait and Station - deferred.   ASSESSMENT/PLAN Sara Boyer is a 82 y.o. female with history of hypertension then hypotension on medication admitted for scalp and back of head soreness since April, jaw claudication for a week, and left vision change/loss yesterday.   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  Symptom improved after steroids treatment CT no acute abnormality CT head and neck unremarkable MRI no acute infarct Temporal artery biopsy likely next week with VVS Temporal artery Doppler pending 2D Echo pending LDL 111 HgbA1c pending ESR 114, CRP 14.1 SCDs for VTE prophylaxis On solumedrol for 3 days and then prednisone 50 daily No indication for antiplatelet at this time, patient not high cardiovascular risk Therapy recommendations: Outpatient PT Disposition: Pending  Hypertension Stable Long term BP goal normotensive  Hyperlipidemia Home meds: None LDL 111, goal < 70 Now on Lipitor 40 Continue statin at discharge  Other Stroke Risk Factors Advanced age Obesity, Body mass index is 30.03 kg/m.   Other Active Problems Mild hypokalemia, supplement  Hospital day # 0    Marvel Plan, MD PhD Stroke  Neurology 12/07/2022 5:50 PM    To contact Stroke Continuity provider, please refer to [email or web address]. After hours, contact General Neurology

## 2022-12-08 ENCOUNTER — Inpatient Hospital Stay (HOSPITAL_COMMUNITY): Payer: PPO

## 2022-12-08 ENCOUNTER — Other Ambulatory Visit: Payer: Self-pay

## 2022-12-08 DIAGNOSIS — J40 Bronchitis, not specified as acute or chronic: Secondary | ICD-10-CM

## 2022-12-08 DIAGNOSIS — I10 Essential (primary) hypertension: Secondary | ICD-10-CM

## 2022-12-08 DIAGNOSIS — G459 Transient cerebral ischemic attack, unspecified: Secondary | ICD-10-CM

## 2022-12-08 DIAGNOSIS — M316 Other giant cell arteritis: Secondary | ICD-10-CM

## 2022-12-08 LAB — ECHOCARDIOGRAM COMPLETE
AR max vel: 2.04 cm2
AV Peak grad: 6.4 mmHg
Ao pk vel: 1.26 m/s
Area-P 1/2: 3.42 cm2
Calc EF: 60.8 %
Height: 61 in
S' Lateral: 3.1 cm
Single Plane A2C EF: 62.6 %
Single Plane A4C EF: 61.2 %
Weight: 2543.23 oz

## 2022-12-08 LAB — HEMOGLOBIN A1C
Hgb A1c MFr Bld: 5.7 % — ABNORMAL HIGH (ref 4.8–5.6)
Mean Plasma Glucose: 117 mg/dL

## 2022-12-08 MED ORDER — ENOXAPARIN SODIUM 40 MG/0.4ML IJ SOSY
40.0000 mg | PREFILLED_SYRINGE | INTRAMUSCULAR | Status: DC
  Administered 2022-12-08 – 2022-12-09 (×2): 40 mg via SUBCUTANEOUS
  Filled 2022-12-08 (×2): qty 0.4

## 2022-12-08 NOTE — Progress Notes (Signed)
Occupational Therapy Treatment/Discharge Patient Details Name: Sara Boyer MRN: [id number] DOB: 01/24/1941 Today's Date: 12/08/2022   History of present illness Patient with  presented to the hospital with complaints of vision changes and left sided facial pain ongoing since 7/6.  CT scan and MRI negative for changes.  PMH of HTN, anemia, and PNA.  Past knee surgery and rotator cuff surgery in 2013.   OT comments  Pt received in bathroom on entry, able to complete remainder of ADLs assessed without assistance. Pt reports preference to use RW for safety due to reported unsteadiness without AD. Discussed ADL/IADL routine with pt and spouse, use of DME and where assistance could be needed. Both pt and spouse very safety conscious, deny any concerns w/ ADLs/IADLs and have all needed DME. Pt's L vision at baseline. No further skilled OT services needed at acute level. Please reconsult if needs change or if vision impairments reoccur hindering functional abilities.    Recommendations for follow up therapy are one component of a multi-disciplinary discharge planning process, led by the attending physician.  Recommendations may be updated based on patient status, additional functional criteria and insurance authorization.    Assistance Recommended at Discharge PRN  Patient can return home with the following  Assistance with cooking/housework;Assist for transportation   Equipment Recommendations  None recommended by OT    Recommendations for Other Services      Precautions / Restrictions Precautions Precautions: Fall Restrictions Weight Bearing Restrictions: No       Mobility Bed Mobility Overal bed mobility: Modified Independent Bed Mobility: Sit to Supine       Sit to supine: Modified independent (Device/Increase time)        Transfers Overall transfer level: Modified independent Equipment used: Rolling walker (2 wheels) Transfers: Sit to/from Stand Sit to Stand: Modified  independent (Device/Increase time)           General transfer comment: from toilet     Balance Overall balance assessment: Needs assistance Sitting-balance support: Feet supported Sitting balance-Leahy Scale: Good     Standing balance support: No upper extremity supported, During functional activity Standing balance-Leahy Scale: Fair                             ADL either performed or assessed with clinical judgement   ADL Overall ADL's : Needs assistance/impaired     Grooming: Modified independent;Standing;Wash/dry hands               Lower Body Dressing: Modified independent;Sitting/lateral leans;Sit to/from stand   Toilet Transfer: Supervision/safety;Ambulation;Rolling walker (2 wheels)   Toileting- Clothing Manipulation and Hygiene: Modified independent;Sitting/lateral lean;Sit to/from stand   Tub/ Shower Transfer: Supervision/safety;Walk-in Copywriter, advertising Details (indicate cue type and reason): simulated stepping over holding to wall without issues        Extremity/Trunk Assessment Upper Extremity Assessment Upper Extremity Assessment: Overall WFL for tasks assessed   Lower Extremity Assessment Lower Extremity Assessment: Defer to PT evaluation        Vision   Vision Assessment?: No apparent visual deficits Additional Comments: No current issues other than R eye still dilated and slightly blurry - anticipate improvements. L eye (which was the impaired eye) [facility] today   Perception     Praxis      Cognition Arousal/Alertness: Awake/alert Behavior During Therapy: WFL for tasks assessed/performed Overall Cognitive Status: Within Functional Limits for tasks assessed  Exercises      Shoulder Instructions       General Comments Husband at bedside    Pertinent Vitals/ Pain       Pain Assessment Pain Assessment: No/denies pain  Home Living                                           Prior Functioning/Environment              Frequency  Min 1X/week        Progress Toward Goals  OT Goals(current goals can now be found in the care plan section)  Progress towards OT goals: Progressing toward goals;Goals met/education completed, patient discharged from OT  Acute Rehab OT Goals Patient Stated Goal: home soon, avoid losing vision OT Goal Formulation: All assessment and education complete, DC therapy Time For Goal Achievement: 12/21/22 Potential to Achieve Goals: Good ADL Goals Pt Will Perform Grooming:  (DC goal) Pt Will Perform Lower Body Bathing:  (DC goal) Pt Will Perform Lower Body Dressing:  (DC goal) Pt Will Transfer to Toilet:  (DC goal) Pt Will Perform Toileting - Clothing Manipulation and hygiene:  (DC goal) Pt Will Perform Tub/Shower Transfer:  (DC goal)  Plan Discharge plan remains appropriate    Co-evaluation                 AM-PAC OT "6 Clicks" Daily Activity     Outcome Measure   Help from another person eating meals?: None Help from another person taking care of personal grooming?: None Help from another person toileting, which includes using toliet, bedpan, or urinal?: None Help from another person bathing (including washing, rinsing, drying)?: A Little Help from another person to put on and taking off regular upper body clothing?: None Help from another person to put on and taking off regular lower body clothing?: A Little 6 Click Score: 22    End of Session Equipment Utilized During Treatment: Rolling walker (2 wheels)  OT Visit Diagnosis: Unsteadiness on feet (R26.81)   Activity Tolerance Patient tolerated treatment well   Patient Left in bed;with call bell/phone within reach;with bed alarm set;with family/visitor present   Nurse Communication Mobility status        Time: 4401-0272 OT Time Calculation (min): 27 min  Charges: OT General Charges $OT Visit: 1 Visit OT  Treatments $Self Care/Home Management : 23-37 mins  Bradd Canary, OTR/L Acute Rehab Services Office: [phone]   Lorre Munroe 12/08/2022, 12:38 PM

## 2022-12-08 NOTE — Progress Notes (Signed)
Echocardiogram 2D Echocardiogram has been performed.  Sara Boyer 12/08/2022, 10:12 AM

## 2022-12-08 NOTE — TOC Initial Note (Signed)
Transition of Care [facility]) - Initial/Assessment Note    Patient Details  Name: Sara Boyer MRN: [id number] Date of Birth: [birth year]  [facility] CM/SW Contact:    Gordy Clement, RN Phone Number: 12/08/2022, 3:45 PM  Clinical Narrative:     CM met with Patient and Spouse bedside, to complete initial assessment. Patient is from home with Spouse.  Patient has all DME  in the home but does not require it : RW; rollator; BSC; wheelchair; cane raise toilet seat; Patient and Husbands PCP has retired  Patient has been set up with [facility] and is very anxious for her Husband to have a PCP arranged on or near same day. CM has requested this. Outpatient PT has been arranged  AVS updated   TOC will continue to follow patient for any additional discharge needs      Expected Discharge Plan: OP Rehab (OPPT recommended) Barriers to Discharge: Continued Medical Work up   Patient Goals and CMS Choice Patient states their goals for this hospitalization and ongoing recovery are:: Go home and get better CMS Medicare.gov Compare Post Acute Care list provided to:: Other (Comment Required) (N/A) Choice offered to / list presented to : NA      Expected Discharge Plan and Services In-house Referral: NA Discharge Planning Services: CM Consult Post Acute Care Choice: NA                   DME Arranged: N/A DME Agency: NA       HH Arranged: NA HH Agency: NA        Prior Living Arrangements/Services   Lives with:: Spouse Patient language and need for interpreter reviewed:: Yes Do you feel safe going back to the place where you live?: Yes      Need for Family Participation in Patient Care: Yes (Comment) Care giver support system in place?: Yes (comment) Current home services: Other (comment) (NONE) Criminal Activity/Legal Involvement Pertinent to Current Situation/Hospitalization: No - Comment as needed  Activities of Daily Living      Permission  Sought/Granted Permission sought to share information with : Case Manager Permission granted to share information with : Yes, Verbal Permission Granted        Permission granted to share info w Relationship: CM     Emotional Assessment Appearance:: Appears younger than stated age Attitude/Demeanor/Rapport: Ambitious, Engaged Affect (typically observed): Appropriate, Pleasant Orientation: : Oriented to Self, Oriented to Place, Oriented to  Time, Oriented to Situation Alcohol / Substance Use: Not Applicable Psych Involvement: No (comment)  Admission diagnosis:  Temporal arteritis (HCC) [M31.6] 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] Vision loss of left eye [H54.62] Vision changes [H53.9] Patient Active Problem List   Diagnosis Date Noted   Temporal arteritis (HCC) 12/07/2022   HTN (hypertension) 12/07/2022   Bronchitis 12/07/2022   Hypokalemia 12/07/2022   Normocytic anemia 12/07/2022   Vision changes 12/06/2022   Hair loss 04/02/2018   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

## 2022-12-08 NOTE — Progress Notes (Signed)
Temporal artery duplex study completed.   Please see CV Proc for preliminary results.   Jonanthan Bolender, RDMS, RVT  

## 2022-12-08 NOTE — Progress Notes (Signed)
PROGRESS NOTE    Sara Boyer  ZOX:[id number] DOB: [birth year] DOA: 12/06/2022 PCP: Pcp, No    Chief Complaint  Patient presents with   Loss of Vision    Brief Narrative:   Patient with PMH of HTN presented to the hospital with complaints of vision changes and left sided facial pain ongoing since 7/6. Recently seen in the PCPs office for hypotension which is ongoing since April 2024.  Patient was asked to stop taking her blood pressure medications. Also was seen for upper respiratory infection symptoms and was started on doxycycline for bronchitis on 7/8. Presents with sudden onset of painless visual changes on the left eye with difficulty with colors for 1 day prior to admission. Neurology was consulted.  Suspecting temporal arteritis.  Assessment & Plan:   Principal Problem:   Temporal arteritis (HCC) Active Problems:   Vision changes   HTN (hypertension)   Bronchitis   Hypokalemia   Normocytic anemia  Revision/headache, elevated inflammatory markers, high concern for giant cell arteritis - CT scan of the head as well as MRI brain negative for any acute abnormality.  CTA head and neck also negative for any large vessel occlusion. -, The marker significantly elevated with a ESR of 114, and CRP of 14.1 -Clinical suspicion for giant cell arteritis, continue with pulsed IV steroids, 3 days, then will start on prednisone 50 mg daily. -Vascular surgery consulted Perel artery biopsy, likely as an outpatient -Ophthalmology consult greatly appreciated, no optic disc swelling in either eye, no pallor -PT/OT consulted   Bronchitis. Patient was on doxycycline.  Hypertension with recent hypotension. Patient has been having history of hypertension and has been on blood pressure medication. Recently in April started to have low blood pressure with frequent dizziness episodes. Medication for her blood pressure has been discontinued now. Monitor.   Hypokalemia. Currently being  replaced. Monitor.   Normocytic anemia. H&H relatively stable. Active bleeding. Will monitor.   Obesity Body mass index is 30.03 kg/m.  Placing the pt at higher risk of poor outcomes.     DVT prophylaxis: Lovenox Code Status: Full Family Communication: none at bedside Disposition:   Status is: Inpatient    Consultants:  Neuroloy Vascular surgery    Subjective:  Still complains of blurry vision  Objective: Vitals:   12/07/22 2000 12/07/22 2303 12/08/22 0312 12/08/22 0810  BP: 116/65 (!) 131/59 124/66   Pulse:  85 82   Resp: 17 20 15    Temp:  97.7 F (36.5 C) 97.8 F (36.6 C) (!) 97.1 F (36.2 C)  TempSrc:  Oral Oral Oral  SpO2:  94% 92%   Weight:      Height:        Intake/Output Summary (Last 24 hours) at 12/08/2022 1259 Last data filed at 12/08/2022 1000 Gross per 24 hour  Intake 855.83 ml  Output --  Net 855.83 ml   Filed Weights   12/06/22 1741  Weight: 72.1 kg    Examination:  Awake Alert, Oriented X 3,frail. Symmetrical Chest wall movement, Good air movement bilaterally, CTAB RRR,No Gallops,Rubs or new Murmurs, No Parasternal Heave +ve B.Sounds, Abd Soft, No tenderness, No rebound - guarding or rigidity. No Cyanosis, Clubbing or edema, No new Rash or bruise      Data Reviewed: I have personally reviewed following labs and imaging studies  CBC: Recent Labs  Lab 12/06/22 1755 12/06/22 1815 12/06/22 1820 12/07/22 0437  WBC 10.4  --   --  9.9  NEUTROABS 7.9*  --   --   --  HGB 11.3* 11.6* 11.6* 11.4*  HCT 34.4* 34.0* 34.0* 33.6*  MCV 88.2  --   --  88.4  PLT 374  --   --  328    Basic Metabolic Panel: Recent Labs  Lab 12/06/22 1755 12/06/22 1815 12/06/22 1820 12/07/22 0437  NA 138 138 138 135  K 3.2* 3.2* 3.2* 3.3*  CL 99 100 100 100  CO2 26  --   --  24  GLUCOSE 100* 101* 101* 160*  BUN 16 16 16 14   CREATININE 0.96 0.90 0.90 0.93  CALCIUM 9.3  --   --  9.0  MG  --   --   --  1.6*  PHOS  --   --   --  3.3     GFR: Estimated Creatinine Clearance: 41.6 mL/min (by C-G formula based on SCr of 0.93 mg/dL).  Liver Function Tests: Recent Labs  Lab 12/06/22 1755  AST 35  ALT 50*  ALKPHOS 80  BILITOT 0.7  PROT 6.8  ALBUMIN 2.7*    CBG: Recent Labs  Lab 12/06/22 1757  GLUCAP 88     No results found for this or any previous visit (from the past 240 hour(s)).       Radiology Studies: TEMPORAL ARTERY  Result Date: 12/08/2022  TEMPORAL ARTERY REPORT Patient Name:  Sara Boyer [facility]  Date of Exam:   12/08/2022 Medical Rec #: [id number]          Accession #:    [id number] Date of Birth: May 10, 1941         Patient Gender: F Patient Age:   82 years Exam Location:  [facility] Procedure:      VAS [place] TEMPORTAL ARTERY BILATERAL Referring Phys: Scheryl Marten XU --------------------------------------------------------------------------------  Indications: Scalp tenderness, jaw pain and blurred vision. High Risk Factors: Age > 50 yrs and female.  Comparison Study: No prior studies. Performing Technologist: Jean Rosenthal RDMS, RVT  Examination Guidelines: Patient in reclined position. 2D, color and spectral doppler sampling in the temporal artery along the hairline and temple in the longitudinal plane. 2D images along the hairline and temple in the transverse plane. Exam is bilateral.  ++-------------------+------------------+ Right Halo POSITIVELeft Halo POSITIVE ++-------------------+------------------+ +--------+----------+-----------+----------+-----------+         Width (cm)Lenght (cm)Width (cm)Lenght (cm) +--------+----------+-----------+----------+-----------+ Proximal0.07                 0.08                  +--------+----------+-----------+----------+-----------+ Mid     0.07                 0.08                  +--------+----------+-----------+----------+-----------+ Distal  0.08                 0.08                   +--------+----------+-----------+----------+-----------+ Summary: 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 Wall thickness >0.04 bilaterally.  *See table(s) above for measurements and observations.    Preliminary    ECHOCARDIOGRAM COMPLETE  Result Date: 12/08/2022    ECHOCARDIOGRAM REPORT   Patient Name:   Sara Boyer [facility] Date of Exam: 12/08/2022 Medical Rec #:  [id number]         Height:       61.0 in Accession #:    [id number]  Weight:       159.0 lb Date of Birth:  [birth year]        BSA:          1.713 m Patient Age:    6 years          BP:           124/66 mmHg Patient Gender: F                 HR:           77 bpm. Exam Location:  Inpatient Procedure: 2D Echo, Cardiac Doppler and Color Doppler Indications:    TIA  History:        Patient has no prior history of Echocardiogram examinations.                 Signs/Symptoms:Shortness of Breath; Risk Factors:Hypertension.  Sonographer:    Raeford Razor Referring Phys: [id number] JINDONG XU IMPRESSIONS  1. Left ventricular ejection fraction, by estimation, is 60 to 65%. The left ventricle has normal function. The left ventricle has no regional wall motion abnormalities. Left ventricular diastolic parameters are consistent with Grade I diastolic dysfunction (impaired relaxation).  2. Right ventricular systolic function is normal. The right ventricular size is normal.  3. The mitral valve is degenerative. Mild mitral valve regurgitation. No evidence of mitral stenosis.  4. The aortic valve is tricuspid. There is moderate calcification of the aortic valve. There is moderate thickening of the aortic valve. Aortic valve regurgitation is not visualized. Aortic valve sclerosis/calcification is present, without any evidence of aortic stenosis.  5. The inferior vena cava is normal in size with greater than 50% respiratory variability, suggesting right atrial pressure of 3 mmHg. FINDINGS  Left Ventricle: Left ventricular  ejection fraction, by estimation, is 60 to 65%. The left ventricle has normal function. The left ventricle has no regional wall motion abnormalities. The left ventricular internal cavity size was normal in size. There is  no left ventricular hypertrophy. Left ventricular diastolic parameters are consistent with Grade I diastolic dysfunction (impaired relaxation). Right Ventricle: The right ventricular size is normal. No increase in right ventricular wall thickness. Right ventricular systolic function is normal. Left Atrium: Left atrial size was normal in size. Right Atrium: Right atrial size was normal in size. Pericardium: There is no evidence of pericardial effusion. Mitral Valve: The mitral valve is degenerative in appearance. There is mild thickening of the mitral valve leaflet(s). There is mild calcification of the mitral valve leaflet(s). Mild mitral valve regurgitation. No evidence of mitral valve stenosis. Tricuspid Valve: The tricuspid valve is normal in structure. Tricuspid valve regurgitation is not demonstrated. No evidence of tricuspid stenosis. Aortic Valve: The aortic valve is tricuspid. There is moderate calcification of the aortic valve. There is moderate thickening of the aortic valve. Aortic valve regurgitation is not visualized. Aortic valve sclerosis/calcification is present, without any  evidence of aortic stenosis. Aortic valve peak gradient measures 6.4 mmHg. Pulmonic Valve: The pulmonic valve was normal in structure. Pulmonic valve regurgitation is not visualized. No evidence of pulmonic stenosis. Aorta: The aortic root is normal in size and structure. Venous: The inferior vena cava is normal in size with greater than 50% respiratory variability, suggesting right atrial pressure of 3 mmHg. IAS/Shunts: No atrial level shunt detected by color flow Doppler.  LEFT VENTRICLE PLAX 2D LVIDd:         4.70 cm     Diastology LVIDs:         3.10  cm     LV e' medial:    9.90 cm/s LV PW:         1.00 cm      LV E/e' medial:  8.2 LV IVS:        1.00 cm     LV e' lateral:   9.79 cm/s LVOT diam:     1.80 cm     LV E/e' lateral: 8.3 LV SV:         54 LV SV Index:   32 LVOT Area:     2.54 cm  LV Volumes (MOD) LV vol d, MOD A2C: 77.3 ml LV vol d, MOD A4C: 89.9 ml LV vol s, MOD A2C: 28.9 ml LV vol s, MOD A4C: 34.9 ml LV SV MOD A2C:     48.4 ml LV SV MOD A4C:     89.9 ml LV SV MOD BP:      50.7 ml RIGHT VENTRICLE             IVC RV Basal diam:  2.70 cm     IVC diam: 1.60 cm RV S prime:     12.60 cm/s TAPSE (M-mode): 2.2 cm LEFT ATRIUM             Index        RIGHT ATRIUM           Index LA diam:        3.70 cm 2.16 cm/m   RA Area:     10.90 cm LA Vol (A2C):   35.8 ml 20.90 ml/m  RA Volume:   23.70 ml  13.83 ml/m LA Vol (A4C):   39.1 ml 22.82 ml/m LA Biplane Vol: 41.2 ml 24.05 ml/m  AORTIC VALVE AV Area (Vmax): 2.04 cm AV Vmax:        126.00 cm/s AV Peak Grad:   6.4 mmHg LVOT Vmax:      101.00 cm/s LVOT Vmean:     68.400 cm/s LVOT VTI:       0.213 m  AORTA Ao Root diam: 2.50 cm Ao Asc diam:  2.70 cm MITRAL VALVE               TRICUSPID VALVE MV Area (PHT): 3.42 cm    TR Peak grad:   7.1 mmHg MV Decel Time: 222 msec    TR Vmax:        133.00 cm/s MV E velocity: 81.50 cm/s MV A velocity: 98.50 cm/s  SHUNTS MV E/A ratio:  0.83        Systemic VTI:  0.21 m                            Systemic Diam: 1.80 cm Charlton Haws MD Electronically signed by Charlton Haws MD Signature Date/Time: 12/08/2022/10:21:07 AM    Final    MR BRAIN WO CONTRAST  Result Date: 12/07/2022 CLINICAL DATA:  Stroke suspected EXAM: MRI HEAD WITHOUT CONTRAST TECHNIQUE: Multiplanar, multiecho pulse sequences of the brain and surrounding structures were obtained without intravenous contrast. COMPARISON:  No prior MRI available, correlation is made with CT head 12/06/2022 FINDINGS: Brain: No restricted diffusion to suggest acute or subacute infarct. No acute hemorrhage, mass, mass effect, or midline shift. No hydrocephalus or extra-axial collection.  Normal pituitary and craniocervical junction. No hemosiderin deposition to suggest remote hemorrhage. Normal cerebral volume for age. Scattered and confluent T2 hyperintense signal in the periventricular white matter, likely the sequela of mild-to-moderate  chronic small vessel ischemic disease. Vascular: Normal arterial flow voids. Skull and upper cervical spine: Normal marrow signal. Sinuses/Orbits: Clear paranasal sinuses. No acute finding in the orbits. Status post bilateral lens replacements. Other: Fluid in the right-greater-than-left mastoid air cells. IMPRESSION: No acute intracranial process. No evidence of acute or subacute infarct. Electronically Signed   By: Wiliam Ke M.D.   On: 12/07/2022 03:45   CT ANGIO HEAD NECK W WO CM  Result Date: 12/07/2022 CLINICAL DATA:  Monocular vision loss EXAM: CT ANGIOGRAPHY HEAD AND NECK WITH AND WITHOUT CONTRAST TECHNIQUE: Multidetector CT imaging of the head and neck was performed using the standard protocol during bolus administration of intravenous contrast. Multiplanar CT image reconstructions and MIPs were obtained to evaluate the vascular anatomy. Carotid stenosis measurements (when applicable) are obtained utilizing NASCET criteria, using the distal internal carotid diameter as the denominator. RADIATION DOSE REDUCTION: This exam was performed according to the departmental dose-optimization program which includes automated exposure control, adjustment of the mA and/or kV according to patient size and/or use of iterative reconstruction technique. CONTRAST:  75mL OMNIPAQUE IOHEXOL 350 MG/ML SOLN COMPARISON:  No prior CTA available, correlation is made with 12/06/2022 CT head FINDINGS: CT HEAD FINDINGS For noncontrast findings, please see same day CT head. CTA NECK FINDINGS Aortic arch: Standard branching. Imaged portion shows no evidence of aneurysm or dissection. No significant stenosis of the major arch vessel origins. Mild aortic atherosclerosis. Right  carotid system: No evidence of dissection, occlusion, or hemodynamically significant stenosis (greater than 50%). Left carotid system: No evidence of dissection, occlusion, or hemodynamically significant stenosis (greater than 50%). Vertebral arteries: No evidence of dissection, occlusion, or hemodynamically significant stenosis (greater than 50%). Skeleton: No acute osseous abnormality. Degenerative changes in the cervical spine. 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 No acute finding. Upper chest: No focal pulmonary opacity or pleural effusion. Review of the MIP images confirms the above findings CTA HEAD FINDINGS Anterior circulation: Both internal carotid arteries are patent to the termini, with scattered calcifications but without significant stenosis. A1 segments patent. Normal anterior communicating artery. Anterior cerebral arteries are patent to their distal aspects without significant stenosis. No M1 stenosis or occlusion. MCA branches perfused to their distal aspects without significant stenosis. Posterior circulation: Vertebral arteries patent to the vertebrobasilar junction without significant stenosis. Posterior inferior cerebellar arteries patent proximally. Basilar patent to its distal aspect without significant stenosis. Superior cerebellar arteries patent proximally. Patent P1 segments. PCAs perfused to their distal aspects without significant stenosis. The bilateral posterior communicating arteries are not visualized. Venous sinuses: Not well opacified due to phase of timing. Anatomic variants: None significant. Review of the MIP images confirms the above findings IMPRESSION: 1. No intracranial large vessel occlusion or significant stenosis. 2. No hemodynamically significant stenosis in the neck. 3. Aortic atherosclerosis. Aortic Atherosclerosis (ICD10-I70.0). Electronically Signed   By: Wiliam Ke M.D.   On: 12/07/2022 00:53   CT HEAD WO CONTRAST  Result Date:  12/06/2022 CLINICAL DATA:  Neuro deficit, acute, stroke suspected EXAM: CT HEAD WITHOUT CONTRAST TECHNIQUE: Contiguous axial images were obtained from the base of the skull through the vertex without intravenous contrast. RADIATION DOSE REDUCTION: This exam was performed according to the departmental dose-optimization program which includes automated exposure control, adjustment of the mA and/or kV according to patient size and/or use of iterative reconstruction technique. COMPARISON:  None Available. FINDINGS: Brain: There is atrophy and chronic small vessel disease changes. No acute intracranial abnormality. Specifically, no hemorrhage, hydrocephalus, mass  lesion, acute infarction, or significant intracranial injury. Vascular: No hyperdense vessel or unexpected calcification. Skull: No acute calvarial abnormality. Sinuses/Orbits: No acute findings Other: None IMPRESSION: Atrophy, chronic microvascular disease. No acute intracranial abnormality. Electronically Signed   By: Charlett Nose M.D.   On: 12/06/2022 19:14        Scheduled Meds:  atorvastatin  40 mg Oral Daily   pantoprazole  40 mg Oral Daily   potassium chloride  20 mEq Oral BID   Continuous Infusions:  sodium chloride 75 mL/hr at 12/07/22 2026   methylPREDNISolone (SOLU-MEDROL) injection 1,000 mg (12/07/22 2100)     LOS: 1 day      Huey Bienenstock, MD [facility]   To contact the attending provider between 7A-7P or the covering provider during after hours 7P-7A, please log into the web site [email or web address] and access using universal [place] password for that web site. If you do not have the password, please call the hospital operator.  12/08/2022, 12:59 PM

## 2022-12-08 NOTE — Progress Notes (Addendum)
Patient voiced swelling around her left eye above lateral and under the left eye. No pain or vision changes. Dr. Randol Kern notified.

## 2022-12-08 NOTE — Progress Notes (Addendum)
STROKE TEAM PROGRESS NOTE   SUBJECTIVE (INTERVAL HISTORY) Her husband is at the bedside.  Patient lying in bed, stated that last night she had episode of diplopia and left eye pain but no vision changes. Today she feels fine. Temporal artery doppler consistent with GCA. Pending biopsy next Monday.    OBJECTIVE Temp:  [97.1 F (36.2 C)-97.8 F (36.6 C)] 97.1 F (36.2 C) (07/12 0810) Pulse Rate:  [82-85] 82 (07/12 0312) Cardiac Rhythm: Heart block (07/12 0800) Resp:  [15-20] 15 (07/12 0312) BP: (112-131)/(55-66) 124/66 (07/12 0312) SpO2:  [92 %-94 %] 92 % (07/12 0312)  Recent Labs  Lab 12/06/22 1757  GLUCAP 88   Recent Labs  Lab 12/06/22 1755 12/06/22 1815 12/06/22 1820 12/07/22 0437  NA 138 138 138 135  K 3.2* 3.2* 3.2* 3.3*  CL 99 100 100 100  CO2 26  --   --  24  GLUCOSE 100* 101* 101* 160*  BUN 16 16 16 14   CREATININE 0.96 0.90 0.90 0.93  CALCIUM 9.3  --   --  9.0  MG  --   --   --  1.6*  PHOS  --   --   --  3.3   Recent Labs  Lab 12/06/22 1755  AST 35  ALT 50*  ALKPHOS 80  BILITOT 0.7  PROT 6.8  ALBUMIN 2.7*   Recent Labs  Lab 12/06/22 1755 12/06/22 1815 12/06/22 1820 12/07/22 0437  WBC 10.4  --   --  9.9  NEUTROABS 7.9*  --   --   --   HGB 11.3* 11.6* 11.6* 11.4*  HCT 34.4* 34.0* 34.0* 33.6*  MCV 88.2  --   --  88.4  PLT 374  --   --  328   No results for input(s): "CKTOTAL", "CKMB", "CKMBINDEX", "TROPONINI" in the last 168 hours. Recent Labs    12/06/22 1755  LABPROT 16.0*  INR 1.3*   No results for input(s): "COLORURINE", "LABSPEC", "PHURINE", "GLUCOSEU", "HGBUR", "BILIRUBINUR", "KETONESUR", "PROTEINUR", "UROBILINOGEN", "NITRITE", "LEUKOCYTESUR" in the last 72 hours.  Invalid input(s): "APPERANCEUR"     Component Value Date/Time   CHOL 160 12/07/2022 0437   TRIG 83 12/07/2022 0437   HDL 32 (L) 12/07/2022 0437   CHOLHDL 5.0 12/07/2022 0437   VLDL 17 12/07/2022 0437   LDLCALC 111 (H) 12/07/2022 0437   Lab Results  Component Value  Date   HGBA1C 5.7 (H) 12/07/2022   No results found for: "LABOPIA", "COCAINSCRNUR", "LABBENZ", "AMPHETMU", "THCU", "LABBARB"  Recent Labs  Lab 12/06/22 1755  ETH <10    I have personally reviewed the radiological images below and agree with the radiology interpretations.  TEMPORAL ARTERY  Result Date: 12/08/2022  TEMPORAL ARTERY REPORT Patient Name:  Sara Boyer [facility]  Date of Exam:   12/08/2022 Medical Rec #: [id number]          Accession #:    [id number] Date of Birth: [birth year]         Patient Gender: F Patient Age:   82 years Exam Location:  [facility] Procedure:      VAS [place] TEMPORTAL ARTERY BILATERAL Referring Phys: Scheryl Marten Adilee Lemme --------------------------------------------------------------------------------  Indications: Scalp tenderness, jaw pain and blurred vision. High Risk Factors: Age > 50 yrs and female.  Comparison Study: No prior studies. Performing Technologist: Jean Rosenthal RDMS, RVT  Examination Guidelines: Patient in reclined position. 2D, color and spectral doppler sampling in the temporal artery along the hairline and temple in the longitudinal plane. 2D images along the  hairline and temple in the transverse plane. Exam is bilateral.  ++-------------------+------------------+ Right Halo POSITIVELeft Halo POSITIVE ++-------------------+------------------+ +--------+----------+-----------+----------+-----------+         Width (cm)Lenght (cm)Width (cm)Lenght (cm) +--------+----------+-----------+----------+-----------+ Proximal0.07                 0.08                  +--------+----------+-----------+----------+-----------+ Mid     0.07                 0.08                  +--------+----------+-----------+----------+-----------+ Distal  0.08                 0.08                  +--------+----------+-----------+----------+-----------+ Summary: 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 Wall thickness >0.04  bilaterally.  *See table(s) above for measurements and observations.    Preliminary    ECHOCARDIOGRAM COMPLETE  Result Date: 12/08/2022    ECHOCARDIOGRAM REPORT   Patient Name:   AMAJAH DANDURAND [facility] Date of Exam: 12/08/2022 Medical Rec #:  [id number]         Height:       61.0 in Accession #:    [id number]        Weight:       159.0 lb Date of Birth:  January 27, 1941        BSA:          1.713 m Patient Age:    23 years          BP:           124/66 mmHg Patient Gender: F                 HR:           77 bpm. Exam Location:  Inpatient Procedure: 2D Echo, Cardiac Doppler and Color Doppler Indications:    TIA  History:        Patient has no prior history of Echocardiogram examinations.                 Signs/Symptoms:Shortness of Breath; Risk Factors:Hypertension.  Sonographer:    Raeford Razor Referring Phys: [id number] Aviah Sorci IMPRESSIONS  1. Left ventricular ejection fraction, by estimation, is 60 to 65%. The left ventricle has normal function. The left ventricle has no regional wall motion abnormalities. Left ventricular diastolic parameters are consistent with Grade I diastolic dysfunction (impaired relaxation).  2. Right ventricular systolic function is normal. The right ventricular size is normal.  3. The mitral valve is degenerative. Mild mitral valve regurgitation. No evidence of mitral stenosis.  4. The aortic valve is tricuspid. There is moderate calcification of the aortic valve. There is moderate thickening of the aortic valve. Aortic valve regurgitation is not visualized. Aortic valve sclerosis/calcification is present, without any evidence of aortic stenosis.  5. The inferior vena cava is normal in size with greater than 50% respiratory variability, suggesting right atrial pressure of 3 mmHg. FINDINGS  Left Ventricle: Left ventricular ejection fraction, by estimation, is 60 to 65%. The left ventricle has normal function. The left ventricle has no regional wall motion abnormalities. The left ventricular internal  cavity size was normal in size. There is  no left ventricular hypertrophy. Left ventricular diastolic parameters are consistent with Grade I diastolic dysfunction (impaired relaxation). Right Ventricle: The right  ventricular size is normal. No increase in right ventricular wall thickness. Right ventricular systolic function is normal. Left Atrium: Left atrial size was normal in size. Right Atrium: Right atrial size was normal in size. Pericardium: There is no evidence of pericardial effusion. Mitral Valve: The mitral valve is degenerative in appearance. There is mild thickening of the mitral valve leaflet(s). There is mild calcification of the mitral valve leaflet(s). Mild mitral valve regurgitation. No evidence of mitral valve stenosis. Tricuspid Valve: The tricuspid valve is normal in structure. Tricuspid valve regurgitation is not demonstrated. No evidence of tricuspid stenosis. Aortic Valve: The aortic valve is tricuspid. There is moderate calcification of the aortic valve. There is moderate thickening of the aortic valve. Aortic valve regurgitation is not visualized. Aortic valve sclerosis/calcification is present, without any  evidence of aortic stenosis. Aortic valve peak gradient measures 6.4 mmHg. Pulmonic Valve: The pulmonic valve was normal in structure. Pulmonic valve regurgitation is not visualized. No evidence of pulmonic stenosis. Aorta: The aortic root is normal in size and structure. Venous: The inferior vena cava is normal in size with greater than 50% respiratory variability, suggesting right atrial pressure of 3 mmHg. IAS/Shunts: No atrial level shunt detected by color flow Doppler.  LEFT VENTRICLE PLAX 2D LVIDd:         4.70 cm     Diastology LVIDs:         3.10 cm     LV e' medial:    9.90 cm/s LV PW:         1.00 cm     LV E/e' medial:  8.2 LV IVS:        1.00 cm     LV e' lateral:   9.79 cm/s LVOT diam:     1.80 cm     LV E/e' lateral: 8.3 LV SV:         54 LV SV Index:   32 LVOT Area:      2.54 cm  LV Volumes (MOD) LV vol d, MOD A2C: 77.3 ml LV vol d, MOD A4C: 89.9 ml LV vol s, MOD A2C: 28.9 ml LV vol s, MOD A4C: 34.9 ml LV SV MOD A2C:     48.4 ml LV SV MOD A4C:     89.9 ml LV SV MOD BP:      50.7 ml RIGHT VENTRICLE             IVC RV Basal diam:  2.70 cm     IVC diam: 1.60 cm RV S prime:     12.60 cm/s TAPSE (M-mode): 2.2 cm LEFT ATRIUM             Index        RIGHT ATRIUM           Index LA diam:        3.70 cm 2.16 cm/m   RA Area:     10.90 cm LA Vol (A2C):   35.8 ml 20.90 ml/m  RA Volume:   23.70 ml  13.83 ml/m LA Vol (A4C):   39.1 ml 22.82 ml/m LA Biplane Vol: 41.2 ml 24.05 ml/m  AORTIC VALVE AV Area (Vmax): 2.04 cm AV Vmax:        126.00 cm/s AV Peak Grad:   6.4 mmHg LVOT Vmax:      101.00 cm/s LVOT Vmean:     68.400 cm/s LVOT VTI:       0.213 m  AORTA Ao Root diam: 2.50 cm Ao Asc diam:  2.70 cm MITRAL VALVE               TRICUSPID VALVE MV Area (PHT): 3.42 cm    TR Peak grad:   7.1 mmHg MV Decel Time: 222 msec    TR Vmax:        133.00 cm/s MV E velocity: 81.50 cm/s MV A velocity: 98.50 cm/s  SHUNTS MV E/A ratio:  0.83        Systemic VTI:  0.21 m                            Systemic Diam: 1.80 cm Charlton Haws MD Electronically signed by Charlton Haws MD Signature Date/Time: 12/08/2022/10:21:07 AM    Final    MR BRAIN WO CONTRAST  Result Date: 12/07/2022 CLINICAL DATA:  Stroke suspected EXAM: MRI HEAD WITHOUT CONTRAST TECHNIQUE: Multiplanar, multiecho pulse sequences of the brain and surrounding structures were obtained without intravenous contrast. COMPARISON:  No prior MRI available, correlation is made with CT head 12/06/2022 FINDINGS: Brain: No restricted diffusion to suggest acute or subacute infarct. No acute hemorrhage, mass, mass effect, or midline shift. No hydrocephalus or extra-axial collection. Normal pituitary and craniocervical junction. No hemosiderin deposition to suggest remote hemorrhage. Normal cerebral volume for age. Scattered and confluent T2 hyperintense signal  in the periventricular white matter, likely the sequela of mild-to-moderate chronic small vessel ischemic disease. Vascular: Normal arterial flow voids. Skull and upper cervical spine: Normal marrow signal. Sinuses/Orbits: Clear paranasal sinuses. No acute finding in the orbits. Status post bilateral lens replacements. Other: Fluid in the right-greater-than-left mastoid air cells. IMPRESSION: No acute intracranial process. No evidence of acute or subacute infarct. Electronically Signed   By: Wiliam Ke M.D.   On: 12/07/2022 03:45   CT ANGIO HEAD NECK W WO CM  Result Date: 12/07/2022 CLINICAL DATA:  Monocular vision loss EXAM: CT ANGIOGRAPHY HEAD AND NECK WITH AND WITHOUT CONTRAST TECHNIQUE: Multidetector CT imaging of the head and neck was performed using the standard protocol during bolus administration of intravenous contrast. Multiplanar CT image reconstructions and MIPs were obtained to evaluate the vascular anatomy. Carotid stenosis measurements (when applicable) are obtained utilizing NASCET criteria, using the distal internal carotid diameter as the denominator. RADIATION DOSE REDUCTION: This exam was performed according to the departmental dose-optimization program which includes automated exposure control, adjustment of the mA and/or kV according to patient size and/or use of iterative reconstruction technique. CONTRAST:  75mL OMNIPAQUE IOHEXOL 350 MG/ML SOLN COMPARISON:  No prior CTA available, correlation is made with 12/06/2022 CT head FINDINGS: CT HEAD FINDINGS For noncontrast findings, please see same day CT head. CTA NECK FINDINGS Aortic arch: Standard branching. Imaged portion shows no evidence of aneurysm or dissection. No significant stenosis of the major arch vessel origins. Mild aortic atherosclerosis. Right carotid system: No evidence of dissection, occlusion, or hemodynamically significant stenosis (greater than 50%). Left carotid system: No evidence of dissection, occlusion, or  hemodynamically significant stenosis (greater than 50%). Vertebral arteries: No evidence of dissection, occlusion, or hemodynamically significant stenosis (greater than 50%). Skeleton: No acute osseous abnormality. Degenerative changes in the cervical spine. 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 No acute finding. Upper chest: No focal pulmonary opacity or pleural effusion. Review of the MIP images confirms the above findings CTA HEAD FINDINGS Anterior circulation: Both internal carotid arteries are patent to the termini, with scattered calcifications but without significant stenosis. A1 segments patent. Normal anterior communicating artery.  Anterior cerebral arteries are patent to their distal aspects without significant stenosis. No M1 stenosis or occlusion. MCA branches perfused to their distal aspects without significant stenosis. Posterior circulation: Vertebral arteries patent to the vertebrobasilar junction without significant stenosis. Posterior inferior cerebellar arteries patent proximally. Basilar patent to its distal aspect without significant stenosis. Superior cerebellar arteries patent proximally. Patent P1 segments. PCAs perfused to their distal aspects without significant stenosis. The bilateral posterior communicating arteries are not visualized. Venous sinuses: Not well opacified due to phase of timing. Anatomic variants: None significant. Review of the MIP images confirms the above findings IMPRESSION: 1. No intracranial large vessel occlusion or significant stenosis. 2. No hemodynamically significant stenosis in the neck. 3. Aortic atherosclerosis. Aortic Atherosclerosis (ICD10-I70.0). Electronically Signed   By: Wiliam Ke M.D.   On: 12/07/2022 00:53   CT HEAD WO CONTRAST  Result Date: 12/06/2022 CLINICAL DATA:  Neuro deficit, acute, stroke suspected EXAM: CT HEAD WITHOUT CONTRAST TECHNIQUE: Contiguous axial images were obtained from the base of the skull through  the vertex without intravenous contrast. RADIATION DOSE REDUCTION: This exam was performed according to the departmental dose-optimization program which includes automated exposure control, adjustment of the mA and/or kV according to patient size and/or use of iterative reconstruction technique. COMPARISON:  None Available. FINDINGS: Brain: There is atrophy and chronic small vessel disease changes. No acute intracranial abnormality. Specifically, no hemorrhage, hydrocephalus, mass lesion, acute infarction, or significant intracranial injury. Vascular: No hyperdense vessel or unexpected calcification. Skull: No acute calvarial abnormality. Sinuses/Orbits: No acute findings Other: None IMPRESSION: Atrophy, chronic microvascular disease. No acute intracranial abnormality. Electronically Signed   By: Charlett Nose M.D.   On: 12/06/2022 19:14   DG Chest 2 View  Result Date: 12/04/2022 CLINICAL DATA:  Cough and congestion. Weakness. Acute upper respiratory infection. EXAM: CHEST - 2 VIEW COMPARISON:  02/19/2013 FINDINGS: Heart size is normal. Mild aortic atherosclerosis is present. There is central bronchial thickening consistent with bronchitis. Mild patchy density in the lingula could be scarring or mild lingular pneumonia. No dense consolidation. No effusion. Chronic degenerative changes affect the spine. IMPRESSION: Bronchitis pattern. Mild patchy density in the lingula could be scarring or mild lingular pneumonia. Electronically Signed   By: Paulina Fusi M.D.   On: 12/04/2022 12:26     PHYSICAL EXAM  Temp:  [97.1 F (36.2 C)-97.8 F (36.6 C)] 97.1 F (36.2 C) (07/12 0810) Pulse Rate:  [82-85] 82 (07/12 0312) Resp:  [15-20] 15 (07/12 0312) BP: (112-131)/(55-66) 124/66 (07/12 0312) SpO2:  [92 %-94 %] 92 % (07/12 0312)  General - Well nourished, well developed, in no apparent distress.  Ophthalmologic - fundi not visualized due to noncooperation.  Cardiovascular - Regular rhythm and rate.  Mental  Status -  Level of arousal and orientation to time, place, and person were intact. Language including expression, naming, repetition, comprehension was assessed and found intact. Fund of Knowledge was assessed and was intact.  Cranial Nerves II - XII - II - Visual field intact OU. III, IV, VI - Extraocular movements intact. V - Facial sensation intact bilaterally. VII - Facial movement intact bilaterally. VIII - Hearing & vestibular intact bilaterally. X - Palate elevates symmetrically. XI - Chin turning & shoulder shrug intact bilaterally. XII - Tongue protrusion intact.  Motor Strength - The patient's strength was normal in all extremities and pronator drift was absent.  Bulk was normal and fasciculations were absent.   Motor Tone - Muscle tone was assessed at the neck and appendages and  was normal.  Reflexes - The patient's reflexes were symmetrical in all extremities and she had no pathological reflexes.  Sensory - Light touch, temperature/pinprick were assessed and were symmetrical.    Coordination - The patient had normal movements in the hands and feet with no ataxia or dysmetria.  Tremor was absent.  Gait and Station - deferred.   ASSESSMENT/PLAN Ms. SHELLI LIVINGS is a 82 y.o. female with history of hypertension then hypotension on medication admitted for scalp and back of head soreness since April, jaw claudication for a week, and left vision change/loss yesterday.   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  Symptom improved after steroids treatment CT no acute abnormality CT head and neck unremarkable MRI no acute infarct Temporal artery biopsy next Monday with [facility]VS as outpatient Temporal artery Doppler consistent with GCA 2D Echo EF 60 to 60% LDL 111 HgbA1c 5.7 ESR 114, CRP 14.1 SCDs for VTE prophylaxis On solumedrol for 3 days and then prednisone 50  daily Will need rheumatology and neurology referral No indication for antiplatelet at this time, patient not high cardiovascular risk Therapy recommendations: Outpatient PT Disposition: Pending  Hypertension Stable Long term BP goal normotensive  Hyperlipidemia Home meds: None LDL 111, goal < 70 Now on Lipitor 40 Continue statin at discharge  Other Stroke Risk Factors Advanced age Obesity, Body mass index is 30.03 kg/m.   Other Active Problems Mild hypokalemia, supplement  Hospital day # 1  Neurology will sign off. Please call with questions. Pt will follow up with stroke clinic NP at [facility] in about 4 weeks. Thanks for the consult.   Marvel Plan, MD PhD Stroke Neurology 12/08/2022 6:24 PM    To contact Stroke Continuity provider, please refer to [email or web address]. After hours, contact General Neurology

## 2022-12-08 NOTE — Plan of Care (Signed)

## 2022-12-08 NOTE — Progress Notes (Signed)
Patient to vascular and echo via wheelchair.

## 2022-12-08 NOTE — Plan of Care (Signed)

## 2022-12-08 NOTE — Consult Note (Signed)
Ophthalmology Consult Note  HPI: Patient is a 82 y.o. female currently admitted to ER for work of GCA.  She had one episode of transient vision loss. Also with HA and temporal pain  Eye exam   VA on near card 20/30 OD (near eye); 20/50 OS (distance eye) EOM full IOP STP OU  L/L wnl C/S wnl AC wnl Lens wnl Vitreous wnl  DFE M/v/p wnl OU  Assessment and Plan: No optic disc swelling in either eye; no pallor Inflammatory markers severely elevated Proceed with GCA w/u due to ongoing suspicion

## 2022-12-09 ENCOUNTER — Other Ambulatory Visit (HOSPITAL_COMMUNITY): Payer: Self-pay

## 2022-12-09 DIAGNOSIS — M316 Other giant cell arteritis: Secondary | ICD-10-CM | POA: Diagnosis not present

## 2022-12-09 MED ORDER — ATORVASTATIN CALCIUM 40 MG PO TABS: 40.0000 mg | ORAL_TABLET | Freq: Every day | ORAL | 0 refills | Status: DC

## 2022-12-09 MED ORDER — PREDNISONE 10 MG PO TABS: ORAL_TABLET | ORAL | 0 refills | Status: DC

## 2022-12-09 MED ORDER — PREDNISONE 10 MG PO TABS
50.0000 mg | ORAL_TABLET | Freq: Every day | ORAL | 0 refills | Status: DC
  Filled 2022-12-09: qty 100, 20d supply, fill #0

## 2022-12-09 MED ORDER — PANTOPRAZOLE SODIUM 40 MG PO TBEC
40.0000 mg | DELAYED_RELEASE_TABLET | Freq: Every day | ORAL | 0 refills | Status: DC
  Filled 2022-12-09: qty 30, 30d supply, fill #0

## 2022-12-09 MED ORDER — SODIUM CHLORIDE 0.9 % IV SOLN
1000.0000 mg | Freq: Every day | INTRAVENOUS | Status: AC
  Administered 2022-12-09: 1000 mg via INTRAVENOUS
  Filled 2022-12-09: qty 16

## 2022-12-09 MED ORDER — PANTOPRAZOLE SODIUM 40 MG PO TBEC: 40.0000 mg | DELAYED_RELEASE_TABLET | Freq: Every day | ORAL | 3 refills | Status: DC

## 2022-12-09 NOTE — Discharge Instructions (Signed)
Patient has appointment scheduled for temporal artery biopsy on Monday, 7/15 at 2:30 PM, she was instructed be no solids after midnight , no liquids after 8 AM of procedure, and to present to front desk at [facility] at 9 AM as discussed with vascular surgery

## 2022-12-09 NOTE — Progress Notes (Signed)
Discharge teaching completed. Patient is discharging home. Patient to discharge home with husband.

## 2022-12-09 NOTE — Discharge Summary (Signed)
Physician Discharge Summary  Sara Boyer:[id number] DOB: 1941/04/14 DOA: 12/06/2022  PCP: Pcp, No  Admit date: 12/06/2022 Discharge date: 12/09/2022  Recommendations for Outpatient Follow-up:  Follow up with PCP in 1-2 weeks Blood referral has been sent to both rheumatology and neurology Patient has appointment scheduled for temporal artery biopsy on Monday, 7/15 at 2:30 PM, she was instructed be no solids after midnight, no liquids after 8 AM, and to present to front desk at [facility] at 9 AM as discussed with vascular surgery  Diet recommendation: Heart Healthy    Brief/Interim Summary:  Patient with PMH of HTN presented to the hospital with complaints of vision changes and left sided facial pain ongoing since 7/6. Recently seen in the PCPs office for hypotension which is ongoing since April 2024.  Patient was asked to stop taking her blood pressure medications. Also was seen for upper respiratory infection symptoms and was started on doxycycline for bronchitis on 7/8. Presents with sudden onset of painless visual changes on the left eye with difficulty with colors for 1 day prior to admission.Neurology was consulted.  Suspecting temporal arteritis.  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  CTA head and neck also negative for any large vessel occlusion. -Inflammatory markers significantly elevated with a ESR of 114, and CRP of 14.1 - Scalp and back of her head soreness since April, jaw claudication for a week, left vision changes/loss as well as diplopia yesterday. Symptom improved after steroids treatment , she reports her vision back to baseline today -Clinical suspicion for giant cell arteritis, treated with pulse steroids 1 g IV Solu-Medrol x 3 days, she will be discharged on prednisone 50 mg oral daily -A  referral has been sent to rheumatology and urology -Vascular surgery consulted regarding temporal artery biopsy, with Dr. Renelda Loma, plan as an outpatient on Monday, patient was given the instruction no solids after midnight, no clears after 8 AM, and to present 9 AM at [facility] on front desk for procedure. -Ophthalmology consult greatly appreciated, no optic disc swelling in either eye, no pallor      Bronchitis. -Treated with on doxycycline.   Hypertension with recent hypotension. Patient has been having history of hypertension and has been on blood pressure medication. Recently in April started to have low blood pressure with frequent dizziness episodes. Pressure meds has been held during hospital stay, blood pressure is elevated so she was resumed on her home meds   Hypokalemia. Currently being replaced.    Normocytic anemia. H&H relatively stable.   Obesity Body mass index is 30.03 kg/m.  Placing the pt at higher risk of poor outcome   Discharge Diagnoses:  Principal Problem:   Temporal arteritis (HCC) Active Problems:   Vision changes   HTN (hypertension)   Bronchitis   Hypokalemia   Normocytic anemia    Discharge Instructions  Discharge Instructions     Ambulatory referral to Neurology   Complete by: As directed    Follow up with stroke clinic at [facility] in about 4 weeks. Thanks.   Ambulatory referral to Rheumatology   Complete by: As directed    Temporal arteritis.  Thanks      Allergies as of 12/09/2022       Reactions   Amlodipine Besylate Other (See Comments)   Lisinopril Other (See Comments)   Tetanus Toxoids  Strawberry Extract Rash, Other (See Comments)   burning when touched on skin        Medication List     STOP taking these medications    doxycycline 100 MG tablet Commonly known as: VIBRA-TABS   methylPREDNISolone 4 MG tablet Commonly known as: MEDROL       TAKE these medications    atorvastatin 40 MG tablet Commonly known as:  LIPITOR Take 1 tablet (40 mg total) by mouth daily. Start taking on: December 10, 2022   CENTRUM SILVER PO Take 1 tablet by mouth daily.   chlorthalidone 25 MG tablet Commonly known as: HYGROTON Take 25 mg by mouth daily.   cholecalciferol 1000 units tablet Commonly known as: VITAMIN D Take 1,000 Units by mouth daily.   pantoprazole 40 MG tablet Commonly known as: PROTONIX Take 1 tablet (40 mg total) by mouth daily. Start taking on: December 10, 2022   pantoprazole 40 MG tablet Commonly known as: PROTONIX Take 1 tablet (40 mg total) by mouth daily. Switch for any other PPI at similar dose and frequency Start taking on: January 10, 2023   Potassium Chloride ER 20 MEQ Tbcr Take 20 mEq by mouth daily.   predniSONE 10 MG tablet Commonly known as: DELTASONE Take 5 tablets (50 mg total) by mouth daily.   predniSONE 10 MG tablet Commonly known as: DELTASONE Please take 50 mg oral daily from 12/29/2022 then decrease to 40 mg oral daily on 01/08/2023 Start taking on: December 29, 2022   telmisartan 20 MG tablet Commonly known as: MICARDIS Take 20 mg by mouth every morning.   VITA-C PO Take 1 tablet by mouth daily.   ZINC PO Take 1 tablet by mouth daily.        Follow-up Information     Shelah Lewandowsky, MD. Schedule an appointment as soon as possible for a visit in 1 week(s).   Specialty: Printmaker information: [address] [place] [address] [phone]         [facility]. Schedule an appointment as soon as possible for a visit in 1 month(s).   Specialty: Neurology Why: stroke clinic Contact information: [address] [place] [address] [phone]               Allergies  Allergen Reactions   Amlodipine Besylate Other (See Comments)   Lisinopril Other (See Comments)   Tetanus Toxoids    Strawberry Extract Rash and Other (See Comments)    burning when touched on skin     Consultations: Neurology Vascular surgery  ophthalmology   Procedures/Studies: TEMPORAL ARTERY  Result Date: 12/08/2022  TEMPORAL ARTERY REPORT Patient Name:  [name] [facility]  Date of Exam:   12/08/2022 Medical Rec #: [id number]          Accession #:    [id number] Date of Birth: [birth year]         Patient Gender: F Patient Age:   17 years Exam Location:  [facility] Procedure:      VAS [place] TEMPORTAL ARTERY BILATERAL Referring Phys: Scheryl Marten XU --------------------------------------------------------------------------------  Indications: Scalp tenderness, jaw pain and blurred vision. High Risk Factors: Age > 50 yrs and female.  Comparison Study: No prior studies. Performing Technologist: Jean Rosenthal RDMS, RVT  Examination Guidelines: Patient in reclined position. 2D, color and spectral doppler sampling in the temporal artery along the hairline and temple in the longitudinal plane. 2D images along the hairline and temple in  the transverse plane. Exam is bilateral.  ++-------------------+------------------+ Right Halo POSITIVELeft Halo POSITIVE ++-------------------+------------------+ +--------+----------+-----------+----------+-----------+         Width (cm)Lenght (cm)Width (cm)Lenght (cm) +--------+----------+-----------+----------+-----------+ Proximal0.07                 0.08                  +--------+----------+-----------+----------+-----------+ Mid     0.07                 0.08                  +--------+----------+-----------+----------+-----------+ Distal  0.08                 0.08                  +--------+----------+-----------+----------+-----------+ Summary: 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 Wall thickness >0.04 bilaterally. Current exam suggests bilateral temporal arterities. Temporal artery biopsy recommended.  *See table(s) above for measurements and observations.  Electronically signed by Marvel Plan MD on 12/08/2022 at 6:31:59 PM.   Final    ECHOCARDIOGRAM COMPLETE  Result Date: 12/08/2022    ECHOCARDIOGRAM REPORT   Patient Name:   ANGIA WHEELESS [facility] Date of Exam: 12/08/2022 Medical Rec #:  [id number]         Height:       61.0 in Accession #:    [id number]        Weight:       159.0 lb Date of Birth:  12/05/1940        BSA:          1.713 m Patient Age:    81 years          BP:           124/66 mmHg Patient Gender: F                 HR:           77 bpm. Exam Location:  Inpatient Procedure: 2D Echo, Cardiac Doppler and Color Doppler Indications:    TIA  History:        Patient has no prior history of Echocardiogram examinations.                 Signs/Symptoms:Shortness of Breath; Risk Factors:Hypertension.  Sonographer:    Raeford Razor Referring Phys: [id number] JINDONG XU IMPRESSIONS  1. Left ventricular ejection fraction, by estimation, is 60 to 65%. The left ventricle has normal function. The left ventricle has no regional wall motion abnormalities. Left ventricular diastolic parameters are consistent with Grade I diastolic dysfunction (impaired relaxation).  2. Right ventricular systolic function is normal. The right ventricular size is normal.  3. The mitral valve is degenerative. Mild mitral valve regurgitation. No evidence of mitral stenosis.  4. The aortic valve is tricuspid. There is moderate calcification of the aortic valve. There is moderate thickening of the aortic valve. Aortic valve regurgitation is not visualized. Aortic valve sclerosis/calcification is present, without any evidence of aortic stenosis.  5. The inferior vena cava is normal in size with greater than 50% respiratory variability, suggesting right atrial pressure of 3 mmHg. FINDINGS  Left Ventricle: Left ventricular ejection fraction, by estimation, is 60 to 65%. The left ventricle has normal function. The left ventricle has no regional wall motion abnormalities. The left ventricular internal cavity size was normal in size. There is   no left ventricular hypertrophy.  Left ventricular diastolic parameters are consistent with Grade I diastolic dysfunction (impaired relaxation). Right Ventricle: The right ventricular size is normal. No increase in right ventricular wall thickness. Right ventricular systolic function is normal. Left Atrium: Left atrial size was normal in size. Right Atrium: Right atrial size was normal in size. Pericardium: There is no evidence of pericardial effusion. Mitral Valve: The mitral valve is degenerative in appearance. There is mild thickening of the mitral valve leaflet(s). There is mild calcification of the mitral valve leaflet(s). Mild mitral valve regurgitation. No evidence of mitral valve stenosis. Tricuspid Valve: The tricuspid valve is normal in structure. Tricuspid valve regurgitation is not demonstrated. No evidence of tricuspid stenosis. Aortic Valve: The aortic valve is tricuspid. There is moderate calcification of the aortic valve. There is moderate thickening of the aortic valve. Aortic valve regurgitation is not visualized. Aortic valve sclerosis/calcification is present, without any  evidence of aortic stenosis. Aortic valve peak gradient measures 6.4 mmHg. Pulmonic Valve: The pulmonic valve was normal in structure. Pulmonic valve regurgitation is not visualized. No evidence of pulmonic stenosis. Aorta: The aortic root is normal in size and structure. Venous: The inferior vena cava is normal in size with greater than 50% respiratory variability, suggesting right atrial pressure of 3 mmHg. IAS/Shunts: No atrial level shunt detected by color flow Doppler.  LEFT VENTRICLE PLAX 2D LVIDd:         4.70 cm     Diastology LVIDs:         3.10 cm     LV e' medial:    9.90 cm/s LV PW:         1.00 cm     LV E/e' medial:  8.2 LV IVS:        1.00 cm     LV e' lateral:   9.79 cm/s LVOT diam:     1.80 cm     LV E/e' lateral: 8.3 LV SV:         54 LV SV Index:   32 LVOT Area:     2.54 cm  LV Volumes (MOD) LV vol d, MOD  A2C: 77.3 ml LV vol d, MOD A4C: 89.9 ml LV vol s, MOD A2C: 28.9 ml LV vol s, MOD A4C: 34.9 ml LV SV MOD A2C:     48.4 ml LV SV MOD A4C:     89.9 ml LV SV MOD BP:      50.7 ml RIGHT VENTRICLE             IVC RV Basal diam:  2.70 cm     IVC diam: 1.60 cm RV S prime:     12.60 cm/s TAPSE (M-mode): 2.2 cm LEFT ATRIUM             Index        RIGHT ATRIUM           Index LA diam:        3.70 cm 2.16 cm/m   RA Area:     10.90 cm LA Vol (A2C):   35.8 ml 20.90 ml/m  RA Volume:   23.70 ml  13.83 ml/m LA Vol (A4C):   39.1 ml 22.82 ml/m LA Biplane Vol: 41.2 ml 24.05 ml/m  AORTIC VALVE AV Area (Vmax): 2.04 cm AV Vmax:        126.00 cm/s AV Peak Grad:   6.4 mmHg LVOT Vmax:      101.00 cm/s LVOT Vmean:     68.400 cm/s LVOT VTI:  0.213 m  AORTA Ao Root diam: 2.50 cm Ao Asc diam:  2.70 cm MITRAL VALVE               TRICUSPID VALVE MV Area (PHT): 3.42 cm    TR Peak grad:   7.1 mmHg MV Decel Time: 222 msec    TR Vmax:        133.00 cm/s MV E velocity: 81.50 cm/s MV A velocity: 98.50 cm/s  SHUNTS MV E/A ratio:  0.83        Systemic VTI:  0.21 m                            Systemic Diam: 1.80 cm Charlton Haws MD Electronically signed by Charlton Haws MD Signature Date/Time: 12/08/2022/10:21:07 AM    Final    MR BRAIN WO CONTRAST  Result Date: 12/07/2022 CLINICAL DATA:  Stroke suspected EXAM: MRI HEAD WITHOUT CONTRAST TECHNIQUE: Multiplanar, multiecho pulse sequences of the brain and surrounding structures were obtained without intravenous contrast. COMPARISON:  No prior MRI available, correlation is made with CT head 12/06/2022 FINDINGS: Brain: No restricted diffusion to suggest acute or subacute infarct. No acute hemorrhage, mass, mass effect, or midline shift. No hydrocephalus or extra-axial collection. Normal pituitary and craniocervical junction. No hemosiderin deposition to suggest remote hemorrhage. Normal cerebral volume for age. Scattered and confluent T2 hyperintense signal in the periventricular white matter,  likely the sequela of mild-to-moderate chronic small vessel ischemic disease. Vascular: Normal arterial flow voids. Skull and upper cervical spine: Normal marrow signal. Sinuses/Orbits: Clear paranasal sinuses. No acute finding in the orbits. Status post bilateral lens replacements. Other: Fluid in the right-greater-than-left mastoid air cells. IMPRESSION: No acute intracranial process. No evidence of acute or subacute infarct. Electronically Signed   By: Wiliam Ke M.D.   On: 12/07/2022 03:45   CT ANGIO HEAD NECK W WO CM  Result Date: 12/07/2022 CLINICAL DATA:  Monocular vision loss EXAM: CT ANGIOGRAPHY HEAD AND NECK WITH AND WITHOUT CONTRAST TECHNIQUE: Multidetector CT imaging of the head and neck was performed using the standard protocol during bolus administration of intravenous contrast. Multiplanar CT image reconstructions and MIPs were obtained to evaluate the vascular anatomy. Carotid stenosis measurements (when applicable) are obtained utilizing NASCET criteria, using the distal internal carotid diameter as the denominator. RADIATION DOSE REDUCTION: This exam was performed according to the departmental dose-optimization program which includes automated exposure control, adjustment of the mA and/or kV according to patient size and/or use of iterative reconstruction technique. CONTRAST:  75mL OMNIPAQUE IOHEXOL 350 MG/ML SOLN COMPARISON:  No prior CTA available, correlation is made with 12/06/2022 CT head FINDINGS: CT HEAD FINDINGS For noncontrast findings, please see same day CT head. CTA NECK FINDINGS Aortic arch: Standard branching. Imaged portion shows no evidence of aneurysm or dissection. No significant stenosis of the major arch vessel origins. Mild aortic atherosclerosis. Right carotid system: No evidence of dissection, occlusion, or hemodynamically significant stenosis (greater than 50%). Left carotid system: No evidence of dissection, occlusion, or hemodynamically significant stenosis (greater  than 50%). Vertebral arteries: No evidence of dissection, occlusion, or hemodynamically significant stenosis (greater than 50%). Skeleton: No acute osseous abnormality. Degenerative changes in the cervical spine. 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 No acute finding. Upper chest: No focal pulmonary opacity or pleural effusion. Review of the MIP images confirms the above findings CTA HEAD FINDINGS Anterior circulation: Both internal carotid arteries are patent to the termini, with  scattered calcifications but without significant stenosis. A1 segments patent. Normal anterior communicating artery. Anterior cerebral arteries are patent to their distal aspects without significant stenosis. No M1 stenosis or occlusion. MCA branches perfused to their distal aspects without significant stenosis. Posterior circulation: Vertebral arteries patent to the vertebrobasilar junction without significant stenosis. Posterior inferior cerebellar arteries patent proximally. Basilar patent to its distal aspect without significant stenosis. Superior cerebellar arteries patent proximally. Patent P1 segments. PCAs perfused to their distal aspects without significant stenosis. The bilateral posterior communicating arteries are not visualized. Venous sinuses: Not well opacified due to phase of timing. Anatomic variants: None significant. Review of the MIP images confirms the above findings IMPRESSION: 1. No intracranial large vessel occlusion or significant stenosis. 2. No hemodynamically significant stenosis in the neck. 3. Aortic atherosclerosis. Aortic Atherosclerosis (ICD10-I70.0). Electronically Signed   By: Wiliam Ke M.D.   On: 12/07/2022 00:53   CT HEAD WO CONTRAST  Result Date: 12/06/2022 CLINICAL DATA:  Neuro deficit, acute, stroke suspected EXAM: CT HEAD WITHOUT CONTRAST TECHNIQUE: Contiguous axial images were obtained from the base of the skull through the vertex without intravenous contrast.  RADIATION DOSE REDUCTION: This exam was performed according to the departmental dose-optimization program which includes automated exposure control, adjustment of the mA and/or kV according to patient size and/or use of iterative reconstruction technique. COMPARISON:  None Available. FINDINGS: Brain: There is atrophy and chronic small vessel disease changes. No acute intracranial abnormality. Specifically, no hemorrhage, hydrocephalus, mass lesion, acute infarction, or significant intracranial injury. Vascular: No hyperdense vessel or unexpected calcification. Skull: No acute calvarial abnormality. Sinuses/Orbits: No acute findings Other: None IMPRESSION: Atrophy, chronic microvascular disease. No acute intracranial abnormality. Electronically Signed   By: Charlett Nose M.D.   On: 12/06/2022 19:14   DG Chest 2 View  Result Date: 12/04/2022 CLINICAL DATA:  Cough and congestion. Weakness. Acute upper respiratory infection. EXAM: CHEST - 2 VIEW COMPARISON:  02/19/2013 FINDINGS: Heart size is normal. Mild aortic atherosclerosis is present. There is central bronchial thickening consistent with bronchitis. Mild patchy density in the lingula could be scarring or mild lingular pneumonia. No dense consolidation. No effusion. Chronic degenerative changes affect the spine. IMPRESSION: Bronchitis pattern. Mild patchy density in the lingula could be scarring or mild lingular pneumonia. Electronically Signed   By: Paulina Fusi M.D.   On: 12/04/2022 12:26      Subjective:  Reports her vision back to baseline, denies any more headache Discharge Exam: Vitals:   12/09/22 1000 12/09/22 1140  BP:    Pulse:    Resp:  20  Temp:  97.8 F (36.6 C)  SpO2: 94%    Vitals:   12/09/22 0330 12/09/22 0800 12/09/22 1000 12/09/22 1140  BP: (!) 150/68     Pulse: 60     Resp: 20   20  Temp: 97.7 F (36.5 C) (!) 97.3 F (36.3 C)  97.8 F (36.6 C)  TempSrc: Oral Oral  Oral  SpO2: 92%  94%   Weight:      Height:         General: Pt is alert, awake, not in acute distress Cardiovascular: RRR, S1/S2 +, no rubs, no gallops Respiratory: CTA bilaterally, no wheezing, no rhonchi Abdominal: Soft, NT, ND, bowel sounds + Extremities: no edema, no cyanosis    The results of significant diagnostics from this hospitalization (including imaging, microbiology, ancillary and laboratory) are listed below for reference.     Microbiology: No results found for this or any previous visit (from  the past 240 hour(s)).   Labs: BNP (last 3 results) No results for input(s): "BNP" in the last 8760 hours. Basic Metabolic Panel: Recent Labs  Lab 12/06/22 1755 12/06/22 1815 12/06/22 1820 12/07/22 0437  NA 138 138 138 135  K 3.2* 3.2* 3.2* 3.3*  CL 99 100 100 100  CO2 26  --   --  24  GLUCOSE 100* 101* 101* 160*  BUN 16 16 16 14   CREATININE 0.96 0.90 0.90 0.93  CALCIUM 9.3  --   --  9.0  MG  --   --   --  1.6*  PHOS  --   --   --  3.3   Liver Function Tests: Recent Labs  Lab 12/06/22 1755  AST 35  ALT 50*  ALKPHOS 80  BILITOT 0.7  PROT 6.8  ALBUMIN 2.7*   No results for input(s): "LIPASE", "AMYLASE" in the last 168 hours. No results for input(s): "AMMONIA" in the last 168 hours. CBC: Recent Labs  Lab 12/06/22 1755 12/06/22 1815 12/06/22 1820 12/07/22 0437  WBC 10.4  --   --  9.9  NEUTROABS 7.9*  --   --   --   HGB 11.3* 11.6* 11.6* 11.4*  HCT 34.4* 34.0* 34.0* 33.6*  MCV 88.2  --   --  88.4  PLT 374  --   --  328   Cardiac Enzymes: No results for input(s): "CKTOTAL", "CKMB", "CKMBINDEX", "TROPONINI" in the last 168 hours. BNP: Invalid input(s): "POCBNP" CBG: Recent Labs  Lab 12/06/22 1757  GLUCAP 88   D-Dimer No results for input(s): "DDIMER" in the last 72 hours. Hgb A1c Recent Labs    12/07/22 0437  HGBA1C 5.7*   Lipid Profile Recent Labs    12/07/22 0437  CHOL 160  HDL 32*  LDLCALC 111*  TRIG 83  CHOLHDL 5.0   Thyroid function studies No results for input(s):  "TSH", "T4TOTAL", "T3FREE", "THYROIDAB" in the last 72 hours.  Invalid input(s): "FREET3" Anemia work up No results for input(s): "VITAMINB12", "FOLATE", "FERRITIN", "TIBC", "IRON", "RETICCTPCT" in the last 72 hours. Urinalysis    Component Value Date/Time   COLORURINE YELLOW 01/19/2012 1256   APPEARANCEUR CLEAR 01/19/2012 1256   LABSPEC 1.020 01/19/2012 1256   PHURINE 6.0 01/19/2012 1256   GLUCOSEU NEGATIVE 01/19/2012 1256   HGBUR NEGATIVE 01/19/2012 1256   BILIRUBINUR NEGATIVE 01/19/2012 1256   KETONESUR NEGATIVE 01/19/2012 1256   PROTEINUR NEGATIVE 01/19/2012 1256   UROBILINOGEN 0.2 01/19/2012 1256   NITRITE NEGATIVE 01/19/2012 1256   LEUKOCYTESUR NEGATIVE 01/19/2012 1256   Sepsis Labs Recent Labs  Lab 12/06/22 1755 12/07/22 0437  WBC 10.4 9.9   Microbiology No results found for this or any previous visit (from the past 240 hour(s)).   Time coordinating discharge: Over 30 minutes  SIGNED:   Huey Bienenstock, MD  [facility] 12/09/2022, 2:46 PM Pager   If 7PM-7AM, please contact night-coverage [email or web address]

## 2022-12-09 NOTE — Plan of Care (Signed)
Patient discharging home today.

## 2022-12-09 NOTE — Progress Notes (Signed)
Mobility Specialist Progress Note    12/09/22 1010  Mobility  Activity Ambulated with assistance to bathroom;Ambulated with assistance in hallway  Level of Assistance Contact guard assist, steadying assist  Assistive Device Front wheel walker  Distance Ambulated (ft) 355 ft ([phone])  Activity Response Tolerated well  Mobility Referral Yes  $Mobility charge 1 Mobility  Mobility Specialist Start Time (ACUTE ONLY) 0946  Mobility Specialist Stop Time (ACUTE ONLY) 1009  Mobility Specialist Time Calculation (min) (ACUTE ONLY) 23 min   Pre-Mobility: 63 HR, 91% SpO2 Post-Mobility: 73 HR, 92% SpO2  Pt received in bed and requesting to use bathroom. After bathroom, pt stood at sink with supervision to wash hands and brush teeth. During walk, took x1 standing rest break c/o some lightheadedness. Returned to chair with call bell in reach. RN notified.   [place] Mobility Specialist  Please Neurosurgeon or Rehab Office at [phone]

## 2022-12-10 ENCOUNTER — Encounter (HOSPITAL_COMMUNITY): Payer: Self-pay | Admitting: Vascular Surgery

## 2022-12-10 ENCOUNTER — Other Ambulatory Visit: Payer: Self-pay

## 2022-12-10 NOTE — Progress Notes (Signed)
PCP - Kirby Funk, MD Cardiologist - Denies Neurologist Marvel Plan MD  EKG - 12/06/2022 Chest x-ray - 12/04/2022 ECHO - 12/08/2022   Sleep Study - Mild Sleep Apnea CPAP - N/A  DM- Denies   Blood Thinner Instructions: N/A Aspirin Instructions: N/A  ERAS Protcol - No.  NPO ordered COVID TEST- N/A  Anesthesia review: Yes  -------------  SDW INSTRUCTIONS:  Your procedure is scheduled on Monday July 15 th. Please report to [facility] Main Entrance "A" at 1220 P.M., and check in at the Admitting office. Call this number if you have problems the morning of surgery: [phone]   Remember: Do not eat or drink after midnight the night before your surgery     Medications to take morning of surgery with a sip of water include: NONE   As of today, STOP taking any Aspirin (unless otherwise instructed by your surgeon), Aleve, Naproxen, Ibuprofen, Motrin, Advil, Goody's, BC's, all herbal medications, fish oil, and all vitamins.    The Morning of Surgery Do not wear jewelry, make-up or nail polish. Do not wear lotions, powders, or perfumes/colognes, or deodorant Do not bring valuables to the hospital. [facility] is not responsible for any belongings or valuables.  If you are a smoker, DO NOT Smoke 24 hours prior to surgery  If you wear a CPAP at night please bring your mask the morning of surgery   Remember that you must have someone to transport you home after your surgery, and remain with you for 24 hours if you are discharged the same day.  Please bring cases for contacts, glasses, hearing aids, dentures or bridgework because it cannot be worn into surgery.   Patients discharged the day of surgery will not be allowed to drive home.   Please shower the NIGHT BEFORE/MORNING OF SURGERY (use antibacterial soap like DIAL soap if possible). Wear comfortable clothes the morning of surgery. Oral Hygiene is also important to reduce your risk of infection.  Remember - BRUSH YOUR  TEETH THE MORNING OF SURGERY WITH YOUR REGULAR TOOTHPASTE  Patient denies shortness of breath, fever, cough and chest pain.

## 2022-12-11 ENCOUNTER — Ambulatory Visit (HOSPITAL_BASED_OUTPATIENT_CLINIC_OR_DEPARTMENT_OTHER): Payer: PPO | Admitting: Anesthesiology

## 2022-12-11 ENCOUNTER — Ambulatory Visit (HOSPITAL_COMMUNITY): Payer: PPO | Admitting: Anesthesiology

## 2022-12-11 ENCOUNTER — Ambulatory Visit (HOSPITAL_COMMUNITY)
Admission: RE | Admit: 2022-12-11 | Discharge: 2022-12-11 | Disposition: A | Discharge status: Home / Self Care | Payer: PPO | Attending: Vascular Surgery | Admitting: Vascular Surgery

## 2022-12-11 ENCOUNTER — Encounter (HOSPITAL_COMMUNITY)
Admission: RE | Disposition: A | Discharge status: Home / Self Care | Payer: Self-pay | Source: Home / Self Care | Attending: Vascular Surgery

## 2022-12-11 ENCOUNTER — Other Ambulatory Visit: Payer: Self-pay

## 2022-12-11 DIAGNOSIS — M316 Other giant cell arteritis: Secondary | ICD-10-CM | POA: Diagnosis not present

## 2022-12-11 DIAGNOSIS — J40 Bronchitis, not specified as acute or chronic: Secondary | ICD-10-CM | POA: Diagnosis not present

## 2022-12-11 DIAGNOSIS — I10 Essential (primary) hypertension: Secondary | ICD-10-CM | POA: Diagnosis not present

## 2022-12-11 DIAGNOSIS — K219 Gastro-esophageal reflux disease without esophagitis: Secondary | ICD-10-CM | POA: Insufficient documentation

## 2022-12-11 DIAGNOSIS — R519 Headache, unspecified: Secondary | ICD-10-CM

## 2022-12-11 HISTORY — DX: Unspecified osteoarthritis, unspecified site: M19.90

## 2022-12-11 HISTORY — PX: ARTERY BIOPSY: SHX891

## 2022-12-11 LAB — SURGICAL PCR SCREEN
MRSA, PCR: NEGATIVE
Staphylococcus aureus: NEGATIVE

## 2022-12-11 SURGERY — BIOPSY TEMPORAL ARTERY
Anesthesia: General | Site: Head | Laterality: Left

## 2022-12-11 MED ORDER — CEFAZOLIN SODIUM-DEXTROSE 2-4 GM/100ML-% IV SOLN
2.0000 g | INTRAVENOUS | Status: AC
  Administered 2022-12-11: 2 g via INTRAVENOUS
  Filled 2022-12-11: qty 100

## 2022-12-11 MED ORDER — 0.9 % SODIUM CHLORIDE (POUR BTL) OPTIME
TOPICAL | Status: DC | PRN
  Administered 2022-12-11: 1000 mL

## 2022-12-11 MED ORDER — PROPOFOL 10 MG/ML IV BOLUS
INTRAVENOUS | Status: DC | PRN
  Administered 2022-12-11: 150 mg via INTRAVENOUS

## 2022-12-11 MED ORDER — CHLORHEXIDINE GLUCONATE 0.12 % MT SOLN
15.0000 mL | Freq: Once | OROMUCOSAL | Status: AC
  Administered 2022-12-11: 15 mL via OROMUCOSAL
  Filled 2022-12-11: qty 15

## 2022-12-11 MED ORDER — MIDAZOLAM HCL 2 MG/2ML IJ SOLN: 0.5000 mg | Freq: Once | INTRAMUSCULAR | Status: DC | PRN

## 2022-12-11 MED ORDER — SODIUM CHLORIDE 0.9 % IV SOLN: INTRAVENOUS | Status: DC

## 2022-12-11 MED ORDER — PROMETHAZINE HCL 25 MG/ML IJ SOLN: 6.2500 mg | INTRAMUSCULAR | Status: DC | PRN

## 2022-12-11 MED ORDER — DEXAMETHASONE SODIUM PHOSPHATE 4 MG/ML IJ SOLN
INTRAMUSCULAR | Status: DC | PRN
  Administered 2022-12-11: 5 mg via INTRAVENOUS

## 2022-12-11 MED ORDER — OXYCODONE HCL 5 MG PO TABS: 5.0000 mg | ORAL_TABLET | Freq: Once | ORAL | Status: DC | PRN

## 2022-12-11 MED ORDER — LIDOCAINE 2% (20 MG/ML) 5 ML SYRINGE
INTRAMUSCULAR | Status: DC | PRN
  Administered 2022-12-11: 40 mg via INTRAVENOUS

## 2022-12-11 MED ORDER — HEPARIN 6000 UNIT IRRIGATION SOLUTION
Status: AC
  Filled 2022-12-11: qty 500

## 2022-12-11 MED ORDER — PHENYLEPHRINE HCL-NACL 20-0.9 MG/250ML-% IV SOLN
INTRAVENOUS | Status: DC | PRN
  Administered 2022-12-11: 30 ug/min via INTRAVENOUS

## 2022-12-11 MED ORDER — LACTATED RINGERS IV SOLN: INTRAVENOUS | Status: DC

## 2022-12-11 MED ORDER — FENTANYL CITRATE (PF) 250 MCG/5ML IJ SOLN
INTRAMUSCULAR | Status: AC
  Filled 2022-12-11: qty 5

## 2022-12-11 MED ORDER — FENTANYL CITRATE (PF) 100 MCG/2ML IJ SOLN
INTRAMUSCULAR | Status: DC | PRN
  Administered 2022-12-11 (×2): 50 ug via INTRAVENOUS

## 2022-12-11 MED ORDER — CHLORHEXIDINE GLUCONATE CLOTH 2 % EX PADS: 6.0000 | MEDICATED_PAD | Freq: Once | CUTANEOUS | Status: DC

## 2022-12-11 MED ORDER — FENTANYL CITRATE (PF) 100 MCG/2ML IJ SOLN
25.0000 ug | INTRAMUSCULAR | Status: DC | PRN
  Administered 2022-12-11 (×2): 25 ug via INTRAVENOUS

## 2022-12-11 MED ORDER — ORAL CARE MOUTH RINSE: 15.0000 mL | Freq: Once | OROMUCOSAL | Status: AC

## 2022-12-11 MED ORDER — LIDOCAINE HCL (PF) 1 % IJ SOLN
INTRAMUSCULAR | Status: AC
  Filled 2022-12-11: qty 30

## 2022-12-11 MED ORDER — EPHEDRINE SULFATE (PRESSORS) 50 MG/ML IJ SOLN
INTRAMUSCULAR | Status: DC | PRN
  Administered 2022-12-11: 5 mg via INTRAVENOUS

## 2022-12-11 MED ORDER — OXYCODONE HCL 5 MG/5ML PO SOLN: 5.0000 mg | Freq: Once | ORAL | Status: DC | PRN

## 2022-12-11 MED ORDER — HEPARIN 6000 UNIT IRRIGATION SOLUTION
Status: DC | PRN
  Administered 2022-12-11: 1

## 2022-12-11 MED ORDER — FENTANYL CITRATE (PF) 100 MCG/2ML IJ SOLN
INTRAMUSCULAR | Status: AC
  Filled 2022-12-11: qty 2

## 2022-12-11 MED ORDER — ONDANSETRON HCL 4 MG/2ML IJ SOLN
INTRAMUSCULAR | Status: DC | PRN
  Administered 2022-12-11: 4 mg via INTRAVENOUS

## 2022-12-11 SURGICAL SUPPLY — 43 items
ADH SKN CLS APL DERMABOND .7 (GAUZE/BANDAGES/DRESSINGS) ×1
BAG COUNTER SPONGE SURGICOUNT (BAG) ×1 IMPLANT
BAG SPNG CNTER NS LX DISP (BAG) ×1
BALL CTTN LRG ABS STRL LF (GAUZE/BANDAGES/DRESSINGS) ×1
CANISTER SUCT 3000ML PPV (MISCELLANEOUS) ×1 IMPLANT
CLIP TI MEDIUM 6 (CLIP) ×1 IMPLANT
CLIP TI WIDE RED SMALL 6 (CLIP) ×1 IMPLANT
CNTNR URN SCR LID CUP LEK RST (MISCELLANEOUS) ×1 IMPLANT
CONT SPEC 4OZ STRL OR WHT (MISCELLANEOUS) ×1
COTTONBALL LRG STERILE PKG (GAUZE/BANDAGES/DRESSINGS) ×1 IMPLANT
COVER SURGICAL LIGHT HANDLE (MISCELLANEOUS) ×1 IMPLANT
DERMABOND ADVANCED .7 DNX12 (GAUZE/BANDAGES/DRESSINGS) ×1 IMPLANT
DRAPE OPHTHALMIC 77X100 STRL (CUSTOM PROCEDURE TRAY) ×1 IMPLANT
ELECT NDL BLADE 2-5/6 (NEEDLE) ×1 IMPLANT
ELECT NEEDLE BLADE 2-5/6 (NEEDLE) ×1 IMPLANT
ELECT REM PT RETURN 9FT ADLT (ELECTROSURGICAL) ×1
ELECTRODE REM PT RTRN 9FT ADLT (ELECTROSURGICAL) ×1 IMPLANT
GAUZE 4X4 16PLY ~~LOC~~+RFID DBL (SPONGE) ×1 IMPLANT
GEL ULTRASOUND 8.5O AQUASONIC (MISCELLANEOUS) ×1 IMPLANT
GLOVE BIOGEL PI IND STRL 8 (GLOVE) ×1 IMPLANT
GOWN STRL REUS W/ TWL LRG LVL3 (GOWN DISPOSABLE) ×1 IMPLANT
GOWN STRL REUS W/TWL 2XL LVL3 (GOWN DISPOSABLE) ×2 IMPLANT
GOWN STRL REUS W/TWL LRG LVL3 (GOWN DISPOSABLE) ×1
KIT BASIN OR (CUSTOM PROCEDURE TRAY) ×1 IMPLANT
KIT TURNOVER KIT B (KITS) ×1 IMPLANT
LOOP VASCULAR MINI 18 RED (MISCELLANEOUS) ×1
NDL HYPO 25GX1X1/2 BEV (NEEDLE) ×1 IMPLANT
NEEDLE HYPO 25GX1X1/2 BEV (NEEDLE) ×1 IMPLANT
NS IRRIG 1000ML POUR BTL (IV SOLUTION) ×1 IMPLANT
PACK GENERAL/GYN (CUSTOM PROCEDURE TRAY) ×1 IMPLANT
PAD ARMBOARD 7.5X6 YLW CONV (MISCELLANEOUS) ×2 IMPLANT
SPIKE FLUID TRANSFER (MISCELLANEOUS) ×1 IMPLANT
SUCTION TUBE FRAZIER 10FR DISP (SUCTIONS) ×1 IMPLANT
SUT MNCRL AB 4-0 PS2 18 (SUTURE) ×1 IMPLANT
SUT PROLENE 6 0 BV (SUTURE) IMPLANT
SUT SILK 3 0 (SUTURE)
SUT SILK 3-0 18XBRD TIE 12 (SUTURE) IMPLANT
SUT VIC AB 3-0 SH 27 (SUTURE) ×1
SUT VIC AB 3-0 SH 27X BRD (SUTURE) ×1 IMPLANT
SYR CONTROL 10ML LL (SYRINGE) ×1 IMPLANT
TOWEL GREEN STERILE (TOWEL DISPOSABLE) ×1 IMPLANT
VASCULAR TIE MINI RED 18IN STL (MISCELLANEOUS) ×1 IMPLANT
WATER STERILE IRR 1000ML POUR (IV SOLUTION) ×1 IMPLANT

## 2022-12-11 NOTE — Transfer of Care (Signed)
Immediate Anesthesia Transfer of Care Note  Patient: Sara Boyer  Procedure(s) Performed: LEFT BIOPSY TEMPORAL ARTERY (Left: Head)  Patient Location: PACU  Anesthesia Type:General  Level of Consciousness: drowsy and responds to stimulation  Airway & Oxygen Therapy: Patient Spontanous Breathing  Post-op Assessment: Report given to RN and Post -op Vital signs reviewed and stable  Post vital signs: Reviewed and stable  Last Vitals:  Vitals Value Taken Time  BP 153/60 12/11/22 1426  Temp 36.4 C 12/11/22 1425  Pulse 76 12/11/22 1428  Resp 13 12/11/22 1428  SpO2 94 % 12/11/22 1428  Vitals shown include unfiled device data.  Last Pain:  Vitals:   12/11/22 1159  TempSrc:   PainSc: 0-No pain         Complications: No notable events documented.

## 2022-12-11 NOTE — Op Note (Signed)
    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 female who presented with left-sided headache, tenderness over the temporal artery, jaw discomfort, visual changes.  She was started on steroids for concern of temporal arteritis/GCA.  After discussing the risk and benefits of left-sided temporal artery biopsy to help with diagnosis, Sara Boyer elected to proceed.  FINDINGS:   Normal-appearing left temporal artery  TECHNIQUE:   Patient brought to the OR laid supine position.  General anesthesia was induced and patient was prepped and draped in standard fashion.  An ultrasound was used to insonate the temporal artery immediately anterior to the tragus.  The site was marked and a longitudinal incision was made roughly 3 cm in length.  Cautery was used, and carried through the dermis.  The temporal artery was readily apparent.  This was freed from surrounding tissue, and ligated distally.  There was some pulsatility within the artery confirming that this was the temporal artery, therefore it was ligated proximally, and the artery placed in a specimen cup for pathology.  The wound bed was irrigated, and closed using Vicryl suture with Monocryl and Dermabond at the level of the skin.  [name], MD [facility] DATE OF DICTATION:   12/11/2022

## 2022-12-11 NOTE — H&P (Signed)
[facility] [place]  Patient seen and examined in preop holding.  No complaints. No changes to medication history or physical exam since last seen. After discussing the risks and benefits of left temporal artery biopsy, Sara Boyer elected to proceed.   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  She has left-sided headache, tenderness over the temporal artery, jaw discomfort with chewing, and visual changes.  She has been started on steroids.  I counseled her about the utility of a temporal artery biopsy-both positive and negative results can help her workup and guide therapy.  Unfortunately, we have no OR availability until early next week.  We will arrange her biopsy as an outpatient as soon as the schedule allows.  CHIEF COMPLAINT: concern for temporal arteritis  HISTORY OF PRESENT ILLNESS: Sara Boyer is a 82 y.o. female admitted to the internal medicine service for visual changes and left-sided facial pain since 12/02/2022.  She also reports a posterior headache.  She reports jaw claudication.  Past Medical History:  Diagnosis Date   Anemia    hx of as a child    Arthritis    Hypertension    Pneumonia    hx of    Shortness of breath    with exertion    Past Surgical History:  Procedure Laterality Date   ABDOMINAL HYSTERECTOMY     APPENDECTOMY     BREAST CYST EXCISION Right    BREAST SURGERY     right breast surgery for benign lump    corrective surgery to open fallopian tubes      DILATION AND CURETTAGE OF UTERUS     several    HEMORRHOID SURGERY     lobe removed from thyroid      SHOULDER OPEN ROTATOR CUFF REPAIR  01/24/2012   Procedure: ROTATOR CUFF REPAIR SHOULDER OPEN;  Surgeon: Jacki Cones, MD;  Location: [facility] ORS;  Service: Orthopedics;  Laterality: Left;  Left Shoulder Rotator Cuff Repair with Graft and Anchors and acrominectomy   TONSILLECTOMY      Family History  Problem Relation Age  of Onset   Breast cancer Cousin 75       again @ 36    Social History   Socioeconomic History   Marital status: Married    Spouse name: Not on file   Number of children: Not on file   Years of education: Not on file   Highest education level: Not on file  Occupational History   Not on file  Tobacco Use   Smoking status: Never   Smokeless tobacco: Never  Substance and Sexual Activity   Alcohol use: Yes    Comment: occasional glass of wine or cocktail    Drug use: No   Sexual activity: Not on file  Other Topics Concern   Not on file  Social History Narrative   Not on file   Social Determinants of Health   Financial Resource Strain: Not on file  Food Insecurity: Not on file  Transportation Needs: Not on file  Physical Activity: Not on file  Stress: Not on file  Social Connections: Not on file  Intimate Partner Violence: Not on file    Allergies  Allergen Reactions   Amlodipine Besylate Other (See Comments)   Lisinopril Other (See Comments)   Tetanus Toxoids    Strawberry Extract Rash and Other (See Comments)    burning when touched on skin  Current Facility-Administered Medications  Medication Dose Route Frequency Provider Last Rate Last Admin   0.9 %  sodium chloride infusion   Intravenous Continuous Victorino Sparrow, MD       ceFAZolin (ANCEF) IVPB 2g/100 mL premix  2 g Intravenous 30 min Pre-Op Victorino Sparrow, MD       chlorhexidine (PERIDEX) 0.12 % solution 15 mL  15 mL Mouth/Throat Once Jairo Ben, MD       Or   Oral care mouth rinse  15 mL Mouth Rinse Once Jairo Ben, MD       Chlorhexidine Gluconate Cloth 2 % PADS 6 each  6 each Topical Once Victorino Sparrow, MD       And   Chlorhexidine Gluconate Cloth 2 % PADS 6 each  6 each Topical Once Victorino Sparrow, MD       lactated ringers infusion   Intravenous Continuous Jairo Ben, MD        PHYSICAL EXAM Vitals:   12/11/22 1113  BP: (!) 182/76  Pulse: 64  Resp: 18  Temp: 98  F (36.7 C)  TempSrc: Oral  SpO2: 97%  Weight: 72.1 kg  Height: 5\' 1"  (1.549 m)   Well-appearing elderly woman in no distress Regular rate and rhythm Unlabored breathing Palpable left temporal artery which is mildly tender to palpation    PERTINENT LABORATORY AND RADIOLOGIC DATA  Most recent CBC    Latest Ref Rng & Units 12/07/2022    4:37 AM 12/06/2022    6:20 PM 12/06/2022    6:15 PM  CBC  WBC 4.0 - 10.5 K/uL 9.9     Hemoglobin 12.0 - 15.0 g/dL 95.6  21.3  08.6   Hematocrit 36.0 - 46.0 % 33.6  34.0  34.0   Platelets 150 - 400 K/uL 328        Most recent CMP    Latest Ref Rng & Units 12/07/2022    4:37 AM 12/06/2022    6:20 PM 12/06/2022    6:15 PM  CMP  Glucose 70 - 99 mg/dL 578  469  629   BUN 8 - 23 mg/dL 14  16  16    Creatinine 0.44 - 1.00 mg/dL 5.28  4.13  2.44   Sodium 135 - 145 mmol/L 135  138  138   Potassium 3.5 - 5.1 mmol/L 3.3  3.2  3.2   Chloride 98 - 111 mmol/L 100  100  100   CO2 22 - 32 mmol/L 24     Calcium 8.9 - 10.3 mg/dL 9.0       Renal function Estimated Creatinine Clearance: 41.6 mL/min (by C-G formula based on SCr of 0.93 mg/dL).  Hgb A1c MFr Bld (%)  Date Value  12/07/2022 5.7 (H)    LDL Cholesterol  Date Value Ref Range Status  12/07/2022 111 (H) 0 - 99 mg/dL Final    Comment:           Total Cholesterol/HDL:CHD Risk Coronary Heart Disease Risk Table                     Men   Women  1/2 Average Risk   3.4   3.3  Average Risk       5.0   4.4  2 X Average Risk   9.6   7.1  3 X Average Risk  23.4   11.0        Use the calculated Patient Ratio above and the CHD  Risk Table to determine the patient's CHD Risk.        ATP III CLASSIFICATION (LDL):  <100     mg/dL   Optimal  962-952  mg/dL   Near or Above                    Optimal  130-159  mg/dL   Borderline  841-324  mg/dL   High  >401     mg/dL   Very High Performed at [facility], [address], [place], [place] [address]     Rande Brunt. Lenell Antu, MD FACS [facility] Phone Number: [phone] 12/11/2022 11:29 AM   Total time spent on preparing this encounter including chart review, data review, collecting history, examining the patient, coordinating care for this new patient, 60 minutes.  Portions of this report may have been transcribed using voice recognition software.  Every effort has been made to ensure accuracy; however, inadvertent computerized transcription errors may still be present.

## 2022-12-11 NOTE — Anesthesia Postprocedure Evaluation (Signed)
Anesthesia Post Note  Patient: Sara Boyer  Procedure(s) Performed: LEFT BIOPSY TEMPORAL ARTERY (Left: Head)     Patient location during evaluation: PACU Anesthesia Type: General Level of consciousness: awake and alert, patient cooperative and oriented Pain control: pain improving. Vital Signs Assessment: post-procedure vital signs reviewed and stable Respiratory status: spontaneous breathing, nonlabored ventilation and respiratory function stable Cardiovascular status: blood pressure returned to baseline and stable Postop Assessment: no apparent nausea or vomiting Anesthetic complications: no   No notable events documented.  Last Vitals:  Vitals:   12/11/22 1430 12/11/22 1445  BP: (!) 157/65 (!) 153/71  Pulse: 69 67  Resp: 12 12  Temp:    SpO2: 92% 94%    Last Pain:  Vitals:   12/11/22 1445  TempSrc:   PainSc: 5                  Elisea Khader,E. Keeleigh Terris

## 2022-12-11 NOTE — Anesthesia Procedure Notes (Signed)
Procedure Name: LMA Insertion Date/Time: 12/11/2022 1:40 PM  Performed by: Loleta Taronda Comacho, CRNAPre-anesthesia Checklist: Patient identified, Suction available, Patient being monitored, Timeout performed and Emergency Drugs available Patient Re-evaluated:Patient Re-evaluated prior to induction Oxygen Delivery Method: Circle system utilized Preoxygenation: Pre-oxygenation with 100% oxygen Induction Type: IV induction Ventilation: Mask ventilation without difficulty LMA: LMA inserted LMA Size: 4.0 Number of attempts: 1

## 2022-12-11 NOTE — Anesthesia Preprocedure Evaluation (Addendum)
Anesthesia Evaluation  Patient identified by MRN, date of birth, ID band Patient awake    Reviewed: Allergy & Precautions, NPO status , Patient's Chart, lab work & pertinent test results  History of Anesthesia Complications Negative for: history of anesthetic complications  Airway Mallampati: II  TM Distance: >3 FB Neck ROM: Full    Dental  (+) Caps, Dental Advisory Given   Pulmonary neg pulmonary ROS   breath sounds clear to auscultation       Cardiovascular hypertension, Pt. on medications (-) angina  Rhythm:Regular Rate:Normal  12/08/2022 ECHO: EF 60 to 65%.  1. The LV has normal function, no regional wall motion abnormalities. Grade I diastolic  dysfunction (impaired relaxation).   2. RVF is normal. The right ventricular size is normal.   3. The mitral valve is degenerative. Mild MR. No evidence of mitral stenosis.   4. The aortic valve is tricuspid. There is moderate calcification of the aortic valve. There is moderate thickening of the aortic valve. AI is not visualized. Aortic valve  sclerosis/calcification is present, without any evidence of aortic stenosis.     Neuro/Psych negative neurological ROS     GI/Hepatic Neg liver ROS,GERD  Medicated and Controlled,,  Endo/Other  negative endocrine ROS    Renal/GU negative Renal ROS     Musculoskeletal  (+) Arthritis ,    Abdominal   Peds  Hematology negative hematology ROS (+)   Anesthesia Other Findings   Reproductive/Obstetrics                             Anesthesia Physical Anesthesia Plan  ASA: 3  Anesthesia Plan: General   Post-op Pain Management: Tylenol PO (pre-op)*   Induction: Intravenous  PONV Risk Score and Plan: 3 and Ondansetron, Treatment may vary due to age or medical condition and Dexamethasone  Airway Management Planned: LMA  Additional Equipment: None  Intra-op Plan:   Post-operative Plan:   Informed  Consent: I have reviewed the patients History and Physical, chart, labs and discussed the procedure including the risks, benefits and alternatives for the proposed anesthesia with the patient or authorized representative who has indicated his/her understanding and acceptance.     Dental advisory given  Plan Discussed with: CRNA and Surgeon  Anesthesia Plan Comments:        Anesthesia Quick Evaluation

## 2022-12-12 ENCOUNTER — Encounter (HOSPITAL_COMMUNITY): Payer: Self-pay | Admitting: Vascular Surgery

## 2022-12-12 LAB — SURGICAL PATHOLOGY

## 2022-12-19 ENCOUNTER — Encounter: Payer: Self-pay | Admitting: Neurology

## 2022-12-19 ENCOUNTER — Inpatient Hospital Stay: Payer: PPO | Admitting: Neurology

## 2022-12-19 VITALS — BP 112/51 | HR 63 | Ht 61.0 in | Wt 151.0 lb

## 2022-12-19 DIAGNOSIS — H539 Unspecified visual disturbance: Secondary | ICD-10-CM

## 2022-12-19 DIAGNOSIS — M316 Other giant cell arteritis: Secondary | ICD-10-CM | POA: Diagnosis not present

## 2022-12-19 NOTE — Progress Notes (Signed)
[facility] [address] [place]. [place] [address] [phone]       OFFICE CONSULT NOTE  Ms. Shon Hough Date of Birth:  January 24, 1941 Medical Record Number:  [id number]   Referring MD: Marvel Plan  Reason for Referral: Giant cell arteritis  HPI: Ms. Fetty is a pleasant 82 year old Caucasian lady seen today for initial office consultation visit.  History is obtained from the patient and review of electronic medical records.  I personally reviewed pertinent available imaging films in PACS.  She has past medical history of hypertension, anemia.  She states at the end of March early April she developed intermittent symptoms of scalp tenderness when she was combing her hair as well as some posterior headache shooting pain down the right side of the neck.  In July she developed intermittent jaw pain and jaw claudication.  On 12/01/2022 she had transient visual disturbance in the left eye.  She was seen in the ER on 12/06/2022 by Dr. Wilford Corner when she mention extreme tenderness in the back of her head as well as visual symptoms with sudden loss of vision in the left eye followed by gradual return of vision with loss of color sensation in the right eye lasting several hours.  Her ESR was significantly elevated 114 and C-reactive protein at 14.1.  CT head showed no acute changes.  MRI scan of the brain showed no acute abnormalities.  CT angiogram of the brain and neck showed no large vessel stenosis or occlusion.  Echocardiogram showed ejection fraction of 60 to 65%.  Temporal artery carotid ultrasound showed bilateral halo sign with increased thickness of the temporal artery walls.  Patient subsequently underwent left temporal artery biopsy by Dr. Sherral Hammers which as per the patient confirmed temporal arteritis but I did not see the actual report.  Patient states she is doing well on steroids she has had no recurrent headaches, jaw pain, scalp tenderness or visual symptoms.  She does have  some trouble sleeping at night.  She has an appointment to see a rheumatologist Dr. Dierdre Forth but does not know when.  She has no prior history of vision loss episodes, migraine headaches, strokes, TIAs or significant neurological problems.  ROS:   14 system review of systems is positive for vision disturbance, headache, neck pain, jaw pain all other systems negative  PMH:  Past Medical History:  Diagnosis Date   Anemia    hx of as a child    Arthritis    Hypertension    Pneumonia    hx of    Shortness of breath    with exertion    Social History:  Social History   Socioeconomic History   Marital status: Married    Spouse name: Not on file   Number of children: Not on file   Years of education: Not on file   Highest education level: Not on file  Occupational History   Not on file  Tobacco Use   Smoking status: Never   Smokeless tobacco: Never  Substance and Sexual Activity   Alcohol use: Yes    Comment: occasional glass of wine or cocktail    Drug use: No   Sexual activity: Not on file  Other Topics Concern   Not on file  Social History Narrative   Not on file   Social Determinants of Health   Financial Resource Strain: Not on file  Food Insecurity: Not on file  Transportation Needs: Not on file  Physical Activity: Not on file  Stress: Not on file  Social Connections: Not on file  Intimate Partner Violence: Not on file    Medications:   Current Outpatient Medications on File Prior to Visit  Medication Sig Dispense Refill   Ascorbic Acid (VITA-C PO) Take 1 tablet by mouth daily.     atorvastatin (LIPITOR) 40 MG tablet Take 1 tablet (40 mg total) by mouth daily. 30 tablet 0   chlorthalidone (HYGROTON) 25 MG tablet Take 25 mg by mouth daily.     cholecalciferol (VITAMIN D) 1000 UNITS tablet Take 1,000 Units by mouth daily.     Multiple Vitamins-Minerals (CENTRUM SILVER PO) Take 1 tablet by mouth daily.     Multiple Vitamins-Minerals (ZINC PO) Take 1 tablet by  mouth daily.     pantoprazole (PROTONIX) 40 MG tablet Take 1 tablet (40 mg total) by mouth daily. 30 tablet 0   [START ON 01/10/2023] pantoprazole (PROTONIX) 40 MG tablet Take 1 tablet (40 mg total) by mouth daily. Switch for any other PPI at similar dose and frequency 30 tablet 3   Potassium Chloride ER 20 MEQ TBCR Take 20 mEq by mouth daily.     predniSONE (DELTASONE) 10 MG tablet Take 5 tablets (50 mg total) by mouth daily. 100 tablet 0   [START ON 12/29/2022] predniSONE (DELTASONE) 10 MG tablet Please take 50 mg oral daily from 12/29/2022 then decrease to 40 mg oral daily on 01/08/2023 140 tablet 0   telmisartan (MICARDIS) 20 MG tablet Take 20 mg by mouth every morning.     No current facility-administered medications on file prior to visit.    Allergies:   Allergies  Allergen Reactions   Amlodipine Besylate Other (See Comments)   Lisinopril Other (See Comments)   Tetanus Toxoids    Strawberry Extract Rash and Other (See Comments)    burning when touched on skin    Physical Exam General: well developed, well nourished pleasant elderly Caucasian lady, seated, in no evident distress Head: head normocephalic and atraumatic.   Neck: supple with no carotid or supraclavicular bruits Cardiovascular: regular rate and rhythm, no murmurs Musculoskeletal: no deformity Skin:  no rash/petichiae Vascular:  Normal pulses all extremities.  Superficial temporal artery pulsations well felt with slight tenderness on the left at the surgical site.  No scalp tenderness.  Neurologic Exam Mental Status: Awake and fully alert. Oriented to place and time. Recent and remote memory intact. Attention span, concentration and fund of knowledge appropriate. Mood and affect appropriate.  Cranial Nerves: Fundoscopic exam reveals sharp disc margins. Pupils equal, briskly reactive to light. Extraocular movements full without nystagmus. Visual fields full to confrontation. Hearing intact. Facial sensation intact. Face,  tongue, palate moves normally and symmetrically.  Motor: Normal bulk and tone. Normal strength in all tested extremity muscles. Sensory.: intact to touch , pinprick , position and vibratory sensation.  Coordination: Rapid alternating movements normal in all extremities. Finger-to-nose and heel-to-shin performed accurately bilaterally. Gait and Station: Arises from chair without difficulty. Stance is normal. Gait demonstrates normal stride length and balance . Able to heel, toe and tandem walk without difficulty.  Reflexes: 1+ and symmetric. Toes downgoing.   NIHSS  0 Modified Rankin  1   ASSESSMENT: 82 year old pleasant Caucasian lady with new onset of headache and jaw pain and visual disturbance secondary to temporal arteritis biopsy-proven who is showing good response after being started on steroids.  Negative neurovascular evaluation     PLAN:I had a long discussion with the patient and her husband regarding the symptoms  of intermittent head and neck pain, scalp tenderness and visual disturbance from biopsy proven temporal arteritis which seems to have subsided after starting steroids.  I recommend she continue prednisone and the current dose of 50 mg daily for 1 more month and start gradually tapering the dose after her meeting with the rheumatologist Dr. Dierdre Forth.  No further stroke related testing is necessary.  Return for follow-up in the future with me only if needed or neurological symptoms develop. Greater than 50% time during this 45-minute consultation visit was spent on counseling and coordination of care about her symptoms of temporal arteritis and discussion about diagnosis and treatment and answering questions. Delia Heady, MD Note: This document was prepared with digital dictation and possible smart phrase technology. Any transcriptional errors that result from this process are unintentional.

## 2022-12-19 NOTE — Patient Instructions (Signed)
I had a long discussion with the patient and her husband regarding the symptoms of intermittent head and neck pain, scalp tenderness and visual disturbance from biopsy proven temporal arteritis which seems to have subsided after starting steroids.  I recommend she continue prednisone and the current dose of 50 mg daily for 1 more month and start gradually tapering the dose after her meeting with the rheumatologist Dr. Dierdre Forth.  No further stroke related testing is necessary.  Return for follow-up in the future with me only if needed or neurological symptoms develop.  Temporal Arteritis  Temporal arteritis is a condition that makes your arteries become inflamed. It can happen in any part of the body. Most often, it affects your head and face. It is also called giant cell arteritis. This condition can cause severe problems such as blindness. Early treatment can help prevent those problems. What are the causes? The cause of this condition is not known. What increases the risk? You may be more likely to get this condition if: You are older than 84 years of age. You are female. You are Caucasian. You are of [place], [place], [place], [place], or [place] ancestry. You have a family history of the condition. You have polymyalgia rheumatica (PMR). This is a condition that causes muscle pain and stiffness. What are the signs or symptoms? Some people with this condition have just one symptom. Others have more than one symptom. Most symptoms are related to the head and face. These may include: Headache. Hard, swollen, or tender temples. Your temples are the areas on either side of your forehead. Pain when you comb your hair or when you lay your head down. Pain in your jaw when you chew. Pain in your throat or tongue. Problems with your vision. These may include sudden loss of vision in one eye or seeing double. Other symptoms may include: Fever. Feeling tired (fatigue). A dry cough. Pain in your  hips and shoulders. Pain in your arms when you exercise. Depression. Weight loss. How is this diagnosed? This condition may be diagnosed based on your symptoms, your medical history, and a physical exam. You may also have tests done. These may include: Blood tests. Biopsy. This is when a small piece of tissue is removed for testing. Imaging tests, such as an ultrasound or MRI. How is this treated? This condition may be treated with medicines to: Reduce inflammation (steroids). Weaken your immune system (immunosuppressants). Your immune system is your body's defense system. Treat vision problems. Follow these instructions at home: Medicines Take over-the-counter and prescription medicines only as told by your health care provider. Take any vitamins or supplements that your provider recommends. These may include vitamin D and calcium to help stop your bones from getting weak. Eating and drinking  Eat foods that are good for your heart. These may include: Foods high in fiber, such as fresh fruits and vegetables, whole grains, and beans. Foods that have healthy fats in them (omega-3 fats), such as fish, flaxseed, and flaxseed oil. Limit foods that are high in saturated fat and cholesterol. These include processed and fried foods, fatty meat, and full-fat dairy. Limit how much salt (sodium) you eat. Include calcium and vitamin D in your diet. Good sources of these include: Low-fat dairy products, such as milk, yogurt, and cheese. Certain fish, such as fresh or canned salmon, tuna, and sardines. Fortified products. This means that they have calcium and vitamin D added to them. These include fortified cereals and juice. General instructions Exercise. Talk with  your provider about what exercises are safe for you. You may be told to do exercises that make your heart beat faster (aerobic exercise). These can help control your blood pressure and prevent bone loss. Stay up-to-date on all of your  vaccines as told by your provider. Keep all follow-up visits. The medicines used to treat this condition can make you more likely to have problems such as bone loss or diabetes. Your provider will check for problems during your follow-up visits. Contact a health care provider if: Your symptoms get worse. You have any signs of infection. These may include fever, swelling, redness, warmth, or tenderness. Get help right away if: You lose your vision. Your pain does not go away, even after you take medicine. You have chest pain. You have trouble breathing. One side of your face or body gets weak or numb all of a sudden. These symptoms may be an emergency. Get help right away. Call 911. Do not wait to see if the symptoms will go away. Do not drive yourself to the hospital. This information is not intended to replace advice given to you by your health care provider. Make sure you discuss any questions you have with your health care provider. Document Revised: 02/27/2022 Document Reviewed: 02/27/2022 Elsevier Patient Education  2024 [employer].

## 2023-01-02 ENCOUNTER — Ambulatory Visit (INDEPENDENT_AMBULATORY_CARE_PROVIDER_SITE_OTHER): Payer: PPO | Admitting: Family Medicine

## 2023-01-02 ENCOUNTER — Encounter: Payer: Self-pay | Admitting: Family Medicine

## 2023-01-02 VITALS — BP 120/58 | HR 57 | Temp 97.7°F | Ht 61.5 in | Wt 155.8 lb

## 2023-01-02 DIAGNOSIS — M316 Other giant cell arteritis: Secondary | ICD-10-CM | POA: Diagnosis not present

## 2023-01-02 DIAGNOSIS — I10 Essential (primary) hypertension: Secondary | ICD-10-CM | POA: Diagnosis not present

## 2023-01-02 NOTE — Assessment & Plan Note (Signed)
Currently on a prolonged course of prednisone. She reports jitteriness and difficulty sleeping due to the prednisone. She is seeing the rheumatologist soon, will defer to their management.

## 2023-01-02 NOTE — Progress Notes (Signed)
New Patient Office Visit  Subjective    Patient ID: Sara Boyer, female    DOB: 03-09-1941  Age: 82 y.o. MRN: [id number]  CC:  Chief Complaint  Patient presents with   Establish Care    HPI FOX DALLY presents to establish care Patient used to see [facility]  but her previous PCP has retired. States she was recently diagnosed with temporal arteritis and is on a long course of steroids. She reports increasing jitteriness and difficulty with sleeping. She is waking up a lot to use the BR, increasing urination, also having night sweats.   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 States that her last testing was normal.  Patient has a history of chronic bronchitis, not taking any inhalers currently, states that it only bother her when she gets sick. States that her mother was a heavy smoker when she was young.   Also has a history of HTN. We reviewed her medications, BP is usually well controlled at home. She occasionally reports some dizziness at home  I have reviewed all aspects of the patient's medical history including social, family, and surgical history.   Current Outpatient Medications  Medication Instructions   Ascorbic Acid (VITA-C PO) 1 tablet, Oral, Daily   chlorthalidone (HYGROTON) 25 mg, Oral, Daily   cholecalciferol (VITAMIN D) 1,000 Units, Oral, Daily   Multiple Vitamins-Minerals (CENTRUM SILVER PO) 1 tablet, Oral, Daily   Multiple Vitamins-Minerals (ZINC PO) 1 tablet, Oral, Daily   OVER THE COUNTER MEDICATION Hair skin and nails-daily   pantoprazole (PROTONIX) 40 mg, Oral, Daily   [START ON 01/10/2023] pantoprazole (PROTONIX) 40 mg, Oral, Daily, Switch for any other PPI at similar dose and frequency   potassium chloride (KLOR-CON M) 10 MEQ tablet 10 mEq, Oral, Daily   predniSONE (DELTASONE) 10 MG tablet Please take 50 mg oral daily from 12/29/2022 then decrease to 40 mg oral daily on 01/08/2023   predniSONE (DELTASONE) 50 mg,  Oral, Daily   telmisartan (MICARDIS) 20 mg, Oral, Every morning    Past Medical History:  Diagnosis Date   Anemia    hx of as a child    Arthritis    Hypertension    Pneumonia    hx of    Shortness of breath    with exertion    Past Surgical History:  Procedure Laterality Date   ABDOMINAL HYSTERECTOMY     APPENDECTOMY     ARTERY BIOPSY Left 12/11/2022   Procedure: LEFT BIOPSY TEMPORAL ARTERY;  Surgeon: Victorino Sparrow, MD;  Location: [facility] OR;  Service: Vascular;  Laterality: Left;   BREAST CYST EXCISION Right    BREAST SURGERY     right breast surgery for benign lump    corrective surgery to open fallopian tubes      DILATION AND CURETTAGE OF UTERUS     several    HEMORRHOID SURGERY     lobe removed from thyroid      SHOULDER OPEN ROTATOR CUFF REPAIR  01/24/2012   Procedure: ROTATOR CUFF REPAIR SHOULDER OPEN;  Surgeon: Jacki Cones, MD;  Location: [facility] ORS;  Service: Orthopedics;  Laterality: Left;  Left Shoulder Rotator Cuff Repair with Graft and Anchors and acrominectomy   TONSILLECTOMY      Family History  Problem Relation Age of Onset   Breast cancer Cousin 65       again @ 63    Social History   Socioeconomic History   Marital status: Married  Spouse name: Not on file   Number of children: Not on file   Years of education: Not on file   Highest education level: Not on file  Occupational History   Not on file  Tobacco Use   Smoking status: Never   Smokeless tobacco: Never  Substance and Sexual Activity   Alcohol use: Yes    Comment: occasional glass of wine or cocktail    Drug use: No   Sexual activity: Not on file  Other Topics Concern   Not on file  Social History Narrative   Not on file   Social Determinants of Health   Financial Resource Strain: Not on file  Food Insecurity: Not on file  Transportation Needs: Not on file  Physical Activity: Not on file  Stress: Not on file  Social Connections: Not on file  Intimate Partner Violence: Not  on file    Review of Systems  All other systems reviewed and are negative.       Objective    BP (!) 120/58 (BP Location: Left Arm, Patient Position: Sitting, Cuff Size: Large)   Pulse (!) 57   Temp 97.7 F (36.5 C) (Oral)   Ht 5' 1.5" (1.562 m)   Wt 155 lb 12.8 oz (70.7 kg)   SpO2 98%   BMI 28.96 kg/m   Physical Exam Vitals reviewed.  Constitutional:      Appearance: Normal appearance. She is well-groomed and normal weight.  Eyes:     Conjunctiva/sclera: Conjunctivae normal.  Neck:     Thyroid: No thyromegaly.  Cardiovascular:     Rate and Rhythm: Normal rate and regular rhythm.     Pulses: Normal pulses.     Heart sounds: S1 normal and S2 normal.  Pulmonary:     Effort: Pulmonary effort is normal.     Breath sounds: Normal breath sounds and air entry.  Abdominal:     General: Bowel sounds are normal.  Musculoskeletal:     Right lower leg: No edema.     Left lower leg: No edema.  Neurological:     Mental Status: She is alert and oriented to person, place, and time. Mental status is at baseline.     Gait: Gait is intact.  Psychiatric:        Mood and Affect: Mood and affect normal.        Speech: Speech normal.        Behavior: Behavior normal.        Judgment: Judgment normal.         Assessment & Plan:  Primary hypertension Assessment & Plan: Current hypertension medications:       Sig   chlorthalidone (HYGROTON) 25 MG tablet (Taking) Take 25 mg by mouth daily.   telmisartan (MICARDIS) 20 MG tablet (Taking) Take 20 mg by mouth every morning.      BP performed in office today, well controlled, continue medications listed above as prescribed.    Temporal arteritis (HCC) Assessment & Plan: Currently on a prolonged course of prednisone. She reports jitteriness and difficulty sleeping due to the prednisone. She is seeing the rheumatologist soon, will defer to their management.      Return in about 6 months (around 07/05/2023) for HTN.    Karie Georges, MD

## 2023-01-02 NOTE — Assessment & Plan Note (Signed)
Current hypertension medications:       Sig   chlorthalidone (HYGROTON) 25 MG tablet (Taking) Take 25 mg by mouth daily.   telmisartan (MICARDIS) 20 MG tablet (Taking) Take 20 mg by mouth every morning.      BP performed in office today, well controlled, continue medications listed above as prescribed.

## 2023-02-27 ENCOUNTER — Telehealth: Payer: Self-pay | Admitting: Family Medicine

## 2023-02-27 MED ORDER — TELMISARTAN 20 MG PO TABS: 20.0000 mg | ORAL_TABLET | Freq: Every day | ORAL | 1 refills | Status: DC

## 2023-02-27 NOTE — Telephone Encounter (Signed)
Rx done. 

## 2023-02-27 NOTE — Telephone Encounter (Signed)
Prescription Request  02/27/2023  LOV: 01/02/2023  What is the name of the medication or equipment? telmisartan (MICARDIS) 20 MG tablet  Have you contacted your pharmacy to request a refill? No   Which pharmacy would you like this sent to?  [facility] - [place], [place] - [address] [place] [address] Phone: [phone] Fax: [phone]    Patient notified that their request is being sent to the clinical staff for review and that they should receive a response within 2 business days.   Please advise at [facility] [phone]

## 2023-02-27 NOTE — Telephone Encounter (Signed)
Ok to refill 90 day supply and 1 refill

## 2023-02-27 NOTE — Addendum Note (Signed)
Addended by: Johnella Moloney on: 02/27/2023 03:09 PM   Modules accepted: Orders

## 2023-04-02 ENCOUNTER — Telehealth: Payer: Self-pay | Admitting: Family Medicine

## 2023-04-02 ENCOUNTER — Encounter: Payer: Self-pay | Admitting: Family Medicine

## 2023-04-02 ENCOUNTER — Ambulatory Visit (INDEPENDENT_AMBULATORY_CARE_PROVIDER_SITE_OTHER): Payer: PPO | Admitting: Family Medicine

## 2023-04-02 VITALS — BP 144/60 | HR 94 | Temp 97.5°F | Ht 61.5 in | Wt 167.3 lb

## 2023-04-02 DIAGNOSIS — J029 Acute pharyngitis, unspecified: Secondary | ICD-10-CM

## 2023-04-02 DIAGNOSIS — R062 Wheezing: Secondary | ICD-10-CM

## 2023-04-02 DIAGNOSIS — R059 Cough, unspecified: Secondary | ICD-10-CM

## 2023-04-02 DIAGNOSIS — M316 Other giant cell arteritis: Secondary | ICD-10-CM

## 2023-04-02 LAB — POCT RAPID STREP A (OFFICE): Rapid Strep A Screen: NEGATIVE

## 2023-04-02 LAB — POCT INFLUENZA A/B
Influenza A, POC: NEGATIVE
Influenza B, POC: NEGATIVE

## 2023-04-02 LAB — POC COVID19 BINAXNOW: SARS Coronavirus 2 Ag: NEGATIVE

## 2023-04-02 MED ORDER — METHYLPREDNISOLONE ACETATE 80 MG/ML IJ SUSP
80.0000 mg | Freq: Once | INTRAMUSCULAR | Status: AC
Start: 2023-04-02 — End: 2023-04-02
  Administered 2023-04-02: 80 mg via INTRAMUSCULAR

## 2023-04-02 MED ORDER — DOXYCYCLINE HYCLATE 100 MG PO CAPS: 100.0000 mg | ORAL_CAPSULE | Freq: Two times a day (BID) | ORAL | 0 refills | Status: DC

## 2023-04-02 MED ORDER — ALBUTEROL SULFATE HFA 108 (90 BASE) MCG/ACT IN AERS: 2.0000 | INHALATION_SPRAY | Freq: Four times a day (QID) | RESPIRATORY_TRACT | 0 refills | Status: DC | PRN

## 2023-04-02 NOTE — Progress Notes (Signed)
Established Patient Office Visit  Subjective   Patient ID: Sara Boyer, female    DOB: Apr 04, 1941  Age: 82 y.o. MRN: [id number]  Chief Complaint  Patient presents with   Cough    Patient complains of cough, Productive with yellow sputum, Tried Coricidin, x2 days    HPI   Sara Boyer is seen with 2-day history of cough which been productive of yellow sputum.  She has had some increased malaise.  Denies any fever.  She has tried over-the-counter Coricidin without much improvement.  She denies any history of asthma.  Non-smoker.  Does have diagnosis of temporal arteritis and is currently on prednisone 15 mg daily and Actemra once weekly.  She feels like her cough and wheezing have been more pronounced at night.  Denies any nausea, vomiting, or diarrhea.  No history of COPD.  Past Medical History:  Diagnosis Date   Anemia    hx of as a child    Arthritis    Hypertension    Pneumonia    hx of    Shortness of breath    with exertion   Past Surgical History:  Procedure Laterality Date   ABDOMINAL HYSTERECTOMY     APPENDECTOMY     ARTERY BIOPSY Left 12/11/2022   Procedure: LEFT BIOPSY TEMPORAL ARTERY;  Surgeon: Victorino Sparrow, MD;  Location: [facility] OR;  Service: Vascular;  Laterality: Left;   BREAST CYST EXCISION Right    BREAST SURGERY     right breast surgery for benign lump    corrective surgery to open fallopian tubes      DILATION AND CURETTAGE OF UTERUS     several    HEMORRHOID SURGERY     lobe removed from thyroid      SHOULDER OPEN ROTATOR CUFF REPAIR  01/24/2012   Procedure: ROTATOR CUFF REPAIR SHOULDER OPEN;  Surgeon: Jacki Cones, MD;  Location: [facility] ORS;  Service: Orthopedics;  Laterality: Left;  Left Shoulder Rotator Cuff Repair with Graft and Anchors and acrominectomy   TONSILLECTOMY      reports that she has never smoked. She has never used smokeless tobacco. She reports current alcohol use. She reports that she does not use drugs. family history includes  Breast cancer (age of onset: 46) in her cousin. Allergies  Allergen Reactions   Amlodipine Besylate Other (See Comments)   Lisinopril Other (See Comments)   Tetanus Toxoids    Strawberry Extract Rash and Other (See Comments)    burning when touched on skin    Review of Systems  Constitutional:  Positive for malaise/fatigue. Negative for chills and fever.  HENT:  Positive for congestion. Negative for sore throat.   Respiratory:  Positive for cough.   Cardiovascular:  Negative for chest pain.  Gastrointestinal:  Negative for abdominal pain, diarrhea, nausea and vomiting.      Objective:     BP (!) 144/60 (BP Location: Left Arm, Patient Position: Sitting, Cuff Size: Normal)   Pulse 94   Temp (!) 97.5 F (36.4 C) (Oral)   Ht 5' 1.5" (1.562 m)   Wt 167 lb 4.8 oz (75.9 kg)   SpO2 94%   BMI 31.10 kg/m  BP Readings from Last 3 Encounters:  04/02/23 (!) 144/60  01/02/23 (!) 120/58  12/19/22 (!) 112/51   Wt Readings from Last 3 Encounters:  04/02/23 167 lb 4.8 oz (75.9 kg)  01/02/23 155 lb 12.8 oz (70.7 kg)  12/19/22 151 lb (68.5 kg)  Physical Exam Vitals reviewed.  Constitutional:      General: She is not in acute distress.    Appearance: Normal appearance. She is not ill-appearing.  Cardiovascular:     Rate and Rhythm: Normal rate and regular rhythm.  Pulmonary:     Comments: She has some diffuse expiratory wheezes.  Pulse oximetry 94% room air.  No rales.  No retractions. Neurological:     Mental Status: She is alert.      No results found for any visits on 04/02/23.  Last CBC Lab Results  Component Value Date   WBC 9.9 12/07/2022   HGB 11.4 (L) 12/07/2022   HCT 33.6 (L) 12/07/2022   MCV 88.4 12/07/2022   MCH 30.0 12/07/2022   RDW 13.4 12/07/2022   PLT 328 12/07/2022   Last metabolic panel Lab Results  Component Value Date   GLUCOSE 160 (H) 12/07/2022   NA 135 12/07/2022   K 3.3 (L) 12/07/2022   CL 100 12/07/2022   CO2 24 12/07/2022   BUN 14  12/07/2022   CREATININE 0.93 12/07/2022   GFRNONAA >60 12/07/2022   CALCIUM 9.0 12/07/2022   PHOS 3.3 12/07/2022   PROT 6.8 12/06/2022   ALBUMIN 2.7 (L) 12/06/2022   BILITOT 0.7 12/06/2022   ALKPHOS 80 12/06/2022   AST 35 12/06/2022   ALT 50 (H) 12/06/2022   ANIONGAP 11 12/07/2022      The ASCVD Risk score (Arnett DK, et al., 2019) failed to calculate for the following reasons:   The 2019 ASCVD risk score is only valid for ages 20 to 45    Assessment & Plan:   Problem List Items Addressed This Visit   None Visit Diagnoses     Cough, unspecified type    -  Primary   Relevant Medications   methylPREDNISolone acetate (DEPO-MEDROL) injection 80 mg (Completed) (Start on 04/02/2023  5:00 PM)   Other Relevant Orders   POC COVID-19   POC Rapid Strep A   POC Influenza A/B   Sore throat       Relevant Orders   POC COVID-19   POC Rapid Strep A   POC Influenza A/B     82 year old female with history of temporal arteritis on chronic prednisone and Actemra therapy who presents with 2-day history of productive cough along with some wheezing.  No history of asthma or COPD.  Non-smoker.  Nontoxic in appearance.  Probably viral trigger but does have very high risk for complications with her age and underlying medical problems and chronic prednisone therapy.  Does have some reactive airway issues on exam today but in no respiratory distress  -Recommend plenty of fluids and rest -She has pulse oximetry capability on her watch and monitor for any O2 sats 90% or lower -Albuterol MDI 2 puffs every 4-6 hours as needed for wheezing -Given her increased risk of progression and complication we decided to go and start doxycycline 100 mg twice daily for 7 days -Consider over-the-counter plain Mucinex -Depo-Medrol 80 mg IM given -Follow-up immediately for any increased fever or shortness of breath  No follow-ups on file.    Evelena Peat, MD

## 2023-04-02 NOTE — Telephone Encounter (Signed)
Spoke with the patient and scheduled an appt today with Dr Caryl Never at 3:45pm.

## 2023-04-02 NOTE — Patient Instructions (Signed)
Follow up for any fever or increased shortness of breath. 

## 2023-04-02 NOTE — Telephone Encounter (Signed)
Imani rn with access nurse is calling the patient has mild sob with productive  cough since sat and per triage nurse pt needs to be seen within 4 hrs nothing avail in time frame and per triage nurse  pt decline to go to [facility]

## 2023-04-02 NOTE — Telephone Encounter (Signed)
I called the patient back and advised if she is feeling worse prior to the appt today with Dr Caryl Never, to seek treatment at an urgent care or ER due to concern from the triage nurse as below.

## 2023-04-09 ENCOUNTER — Other Ambulatory Visit: Payer: Self-pay

## 2023-04-09 ENCOUNTER — Emergency Department (HOSPITAL_BASED_OUTPATIENT_CLINIC_OR_DEPARTMENT_OTHER): Payer: PPO

## 2023-04-09 ENCOUNTER — Inpatient Hospital Stay (HOSPITAL_BASED_OUTPATIENT_CLINIC_OR_DEPARTMENT_OTHER)
Admission: EM | Admit: 2023-04-09 | Discharge: 2023-04-11 | DRG: 871 | Disposition: A | Discharge status: Home / Self Care | Payer: PPO | Attending: Internal Medicine | Admitting: Internal Medicine

## 2023-04-09 ENCOUNTER — Encounter (HOSPITAL_BASED_OUTPATIENT_CLINIC_OR_DEPARTMENT_OTHER): Payer: Self-pay

## 2023-04-09 DIAGNOSIS — J9811 Atelectasis: Secondary | ICD-10-CM | POA: Diagnosis present

## 2023-04-09 DIAGNOSIS — K219 Gastro-esophageal reflux disease without esophagitis: Secondary | ICD-10-CM | POA: Diagnosis present

## 2023-04-09 DIAGNOSIS — Z7952 Long term (current) use of systemic steroids: Secondary | ICD-10-CM

## 2023-04-09 DIAGNOSIS — J42 Unspecified chronic bronchitis: Secondary | ICD-10-CM | POA: Diagnosis present

## 2023-04-09 DIAGNOSIS — E860 Dehydration: Secondary | ICD-10-CM | POA: Diagnosis present

## 2023-04-09 DIAGNOSIS — M316 Other giant cell arteritis: Secondary | ICD-10-CM | POA: Diagnosis present

## 2023-04-09 DIAGNOSIS — T380X5A Adverse effect of glucocorticoids and synthetic analogues, initial encounter: Secondary | ICD-10-CM | POA: Diagnosis present

## 2023-04-09 DIAGNOSIS — A419 Sepsis, unspecified organism: Principal | ICD-10-CM | POA: Diagnosis present

## 2023-04-09 DIAGNOSIS — E876 Hypokalemia: Secondary | ICD-10-CM | POA: Diagnosis present

## 2023-04-09 DIAGNOSIS — R739 Hyperglycemia, unspecified: Secondary | ICD-10-CM

## 2023-04-09 DIAGNOSIS — Z1152 Encounter for screening for COVID-19: Secondary | ICD-10-CM

## 2023-04-09 DIAGNOSIS — Z888 Allergy status to other drugs, medicaments and biological substances status: Secondary | ICD-10-CM

## 2023-04-09 DIAGNOSIS — J9601 Acute respiratory failure with hypoxia: Secondary | ICD-10-CM

## 2023-04-09 DIAGNOSIS — J189 Pneumonia, unspecified organism: Secondary | ICD-10-CM | POA: Diagnosis present

## 2023-04-09 DIAGNOSIS — R6520 Severe sepsis without septic shock: Secondary | ICD-10-CM

## 2023-04-09 DIAGNOSIS — Z8739 Personal history of other diseases of the musculoskeletal system and connective tissue: Secondary | ICD-10-CM

## 2023-04-09 DIAGNOSIS — I10 Essential (primary) hypertension: Secondary | ICD-10-CM | POA: Insufficient documentation

## 2023-04-09 LAB — CBC WITH DIFFERENTIAL/PLATELET
Abs Immature Granulocytes: 0.1 10*3/uL — ABNORMAL HIGH (ref 0.00–0.07)
Basophils Absolute: 0 10*3/uL (ref 0.0–0.1)
Basophils Relative: 0 %
Eosinophils Absolute: 0 10*3/uL (ref 0.0–0.5)
Eosinophils Relative: 0 %
HCT: 37.6 % (ref 36.0–46.0)
Hemoglobin: 13.3 g/dL (ref 12.0–15.0)
Immature Granulocytes: 1 %
Lymphocytes Relative: 6 %
Lymphs Abs: 1 10*3/uL (ref 0.7–4.0)
MCH: 33.2 pg (ref 26.0–34.0)
MCHC: 35.4 g/dL (ref 30.0–36.0)
MCV: 93.8 fL (ref 80.0–100.0)
Monocytes Absolute: 0.7 10*3/uL (ref 0.1–1.0)
Monocytes Relative: 4 %
Neutro Abs: 16 10*3/uL — ABNORMAL HIGH (ref 1.7–7.7)
Neutrophils Relative %: 89 %
Platelets: 183 10*3/uL (ref 150–400)
RBC: 4.01 MIL/uL (ref 3.87–5.11)
RDW: 15 % (ref 11.5–15.5)
WBC: 17.9 10*3/uL — ABNORMAL HIGH (ref 4.0–10.5)
nRBC: 0 % (ref 0.0–0.2)

## 2023-04-09 LAB — COMPREHENSIVE METABOLIC PANEL
ALT: 35 U/L (ref 0–44)
AST: 16 U/L (ref 15–41)
Albumin: 4.1 g/dL (ref 3.5–5.0)
Alkaline Phosphatase: 37 U/L — ABNORMAL LOW (ref 38–126)
Anion gap: 12 (ref 5–15)
BUN: 36 mg/dL — ABNORMAL HIGH (ref 8–23)
CO2: 28 mmol/L (ref 22–32)
Calcium: 8.9 mg/dL (ref 8.9–10.3)
Chloride: 98 mmol/L (ref 98–111)
Creatinine, Ser: 0.98 mg/dL (ref 0.44–1.00)
GFR, Estimated: 57 mL/min — ABNORMAL LOW (ref >60)
Glucose, Bld: 228 mg/dL — ABNORMAL HIGH (ref 70–99)
Potassium: 2.5 mmol/L — CL (ref 3.5–5.1)
Sodium: 138 mmol/L (ref 135–145)
Total Bilirubin: 1.2 mg/dL — ABNORMAL HIGH (ref <1.2)
Total Protein: 6.1 g/dL — ABNORMAL LOW (ref 6.5–8.1)

## 2023-04-09 LAB — RESP PANEL BY RT-PCR (RSV, FLU A&B, COVID)  RVPGX2
Influenza A by PCR: NEGATIVE
Influenza B by PCR: NEGATIVE
Resp Syncytial Virus by PCR: NEGATIVE
SARS Coronavirus 2 by RT PCR: NEGATIVE

## 2023-04-09 LAB — LACTIC ACID, PLASMA: Lactic Acid, Venous: 3.3 mmol/L (ref 0.5–1.9)

## 2023-04-09 LAB — APTT: aPTT: 20 s — ABNORMAL LOW (ref 24–36)

## 2023-04-09 LAB — PROTIME-INR
INR: 1 (ref 0.8–1.2)
Prothrombin Time: 13.5 s (ref 11.4–15.2)

## 2023-04-09 MED ORDER — POTASSIUM CHLORIDE 10 MEQ/100ML IV SOLN
10.0000 meq | INTRAVENOUS | Status: AC
  Administered 2023-04-10 (×3): 10 meq via INTRAVENOUS
  Filled 2023-04-09 (×3): qty 100

## 2023-04-09 MED ORDER — SODIUM CHLORIDE 0.9% FLUSH
10.0000 mL | Freq: Two times a day (BID) | INTRAVENOUS | Status: DC
  Administered 2023-04-09 – 2023-04-11 (×4): 10 mL via INTRAVENOUS

## 2023-04-09 MED ORDER — POTASSIUM CHLORIDE CRYS ER 20 MEQ PO TBCR
40.0000 meq | EXTENDED_RELEASE_TABLET | Freq: Once | ORAL | Status: AC
  Administered 2023-04-10: 40 meq via ORAL
  Filled 2023-04-09: qty 2

## 2023-04-09 MED ORDER — SODIUM CHLORIDE 0.9 % IV SOLN
1.0000 g | Freq: Once | INTRAVENOUS | Status: AC
  Administered 2023-04-10: 1 g via INTRAVENOUS
  Filled 2023-04-09: qty 10

## 2023-04-09 MED ORDER — DEXTROSE 5 % IV SOLN
500.0000 mg | Freq: Once | INTRAVENOUS | Status: AC
  Administered 2023-04-09: 500 mg via INTRAVENOUS
  Filled 2023-04-09: qty 5

## 2023-04-09 MED ORDER — ALBUTEROL SULFATE HFA 108 (90 BASE) MCG/ACT IN AERS: 2.0000 | INHALATION_SPRAY | RESPIRATORY_TRACT | Status: DC | PRN

## 2023-04-09 MED ORDER — LACTATED RINGERS IV BOLUS (SEPSIS)
500.0000 mL | Freq: Once | INTRAVENOUS | Status: AC
  Administered 2023-04-09: 500 mL via INTRAVENOUS

## 2023-04-09 NOTE — ED Notes (Addendum)
Pt stated she needed to go to the bathroom immediately during triage. Triage had to be paused.

## 2023-04-09 NOTE — ED Provider Notes (Signed)
[place] EMERGENCY DEPARTMENT AT [facility] Provider Note  CSN: [id number] Arrival date & time: 04/09/23 2133  Chief Complaint(s) Cough, Fever, and Shortness of Breath  HPI Sara Boyer is a 82 y.o. female {Add pertinent medical, surgical, social history, OB history to HPI:1}    Cough Associated symptoms: fever and shortness of breath   Fever Associated symptoms: cough   Shortness of Breath Associated symptoms: cough and fever     Past Medical History Past Medical History:  Diagnosis Date   Anemia    hx of as a child    Arthritis    Hypertension    Pneumonia    hx of    Shortness of breath    with exertion   Patient Active Problem List   Diagnosis Date Noted   Temporal arteritis (HCC) 12/07/2022   HTN (hypertension) 12/07/2022   Bronchitis 12/07/2022   Hypokalemia 12/07/2022   Normocytic anemia 12/07/2022   Vision changes 12/06/2022   Hair loss 04/02/2018   H/O partial thyroidectomy 04/02/2018   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 Authorizing Provider  albuterol (VENTOLIN HFA) 108 (90 Base) MCG/ACT inhaler Inhale 2 puffs into the lungs every 6 (six) hours as needed for wheezing or shortness of breath. 04/02/23   Burchette, Elberta Fortis, MD  Ascorbic Acid (VITA-C PO) Take 1 tablet by mouth daily.    [provider]  chlorthalidone (HYGROTON) 25 MG tablet Take 25 mg by mouth daily.    [provider]  cholecalciferol (VITAMIN D) 1000 UNITS tablet Take 1,000 Units by mouth daily.    [provider]  doxycycline (VIBRAMYCIN) 100 MG capsule Take 1 capsule (100 mg total) by mouth 2 (two) times daily. 04/02/23   Burchette, Elberta Fortis, MD  Multiple Vitamins-Minerals (CENTRUM SILVER PO) Take 1 tablet by mouth daily.    [provider]   Multiple Vitamins-Minerals (ZINC PO) Take 1 tablet by mouth daily.    [provider]  OVER THE COUNTER MEDICATION Hair skin and nails-daily    [provider]  pantoprazole (PROTONIX) 40 MG tablet Take 1 tablet (40 mg total) by mouth daily. 12/10/22   Elgergawy, Leana Roe, MD  pantoprazole (PROTONIX) 40 MG tablet Take 1 tablet (40 mg total) by mouth daily. Switch for any other PPI at similar dose and frequency 01/10/23   Elgergawy, Leana Roe, MD  potassium chloride (KLOR-CON M) 10 MEQ tablet Take 10 mEq by mouth daily.    [provider]  predniSONE (DELTASONE) 10 MG tablet Take 5 tablets (50 mg total) by mouth daily. 12/09/22   Elgergawy, Leana Roe, MD  predniSONE (DELTASONE) 10 MG tablet Please take 50 mg oral daily from 12/29/2022 then decrease to 40 mg oral daily on 01/08/2023 12/29/22   Elgergawy, Leana Roe, MD  telmisartan (MICARDIS) 20 MG tablet Take 20 mg by mouth every morning. 11/06/22   [provider]  telmisartan (MICARDIS) 20 MG tablet Take 1 tablet (20 mg total) by mouth daily. 02/27/23   Karie Georges, MD  Allergies Amlodipine besylate, Lisinopril, Tetanus toxoids, and Strawberry extract  Review of Systems Review of Systems  Constitutional:  Positive for fever.  Respiratory:  Positive for cough and shortness of breath.    As noted in HPI  Physical Exam Vital Signs  I have reviewed the triage vital signs BP 117/64   Pulse 94   Temp 98 F (36.7 C)   Resp 20   Ht 5\' 1"  (1.549 m)   Wt 75.8 kg   SpO2 90%   BMI 31.55 kg/m  *** Physical Exam  ED Results and Treatments Labs (all labs ordered are listed, but only abnormal results are displayed) Labs Reviewed  CBC WITH DIFFERENTIAL/PLATELET - Abnormal; Notable for the following components:      Result Value   WBC 17.9 (*)    Neutro Abs 16.0 (*)    Abs Immature  Granulocytes 0.10 (*)    All other components within normal limits  RESP PANEL BY RT-PCR (RSV, FLU A&B, COVID)  RVPGX2  CULTURE, BLOOD (ROUTINE X 2)  CULTURE, BLOOD (ROUTINE X 2)  LACTIC ACID, PLASMA  LACTIC ACID, PLASMA  COMPREHENSIVE METABOLIC PANEL  PROTIME-INR  APTT  URINALYSIS, W/ REFLEX TO CULTURE (INFECTION SUSPECTED)                                                                                                                         EKG  EKG Interpretation Date/Time:    Ventricular Rate:    PR Interval:    QRS Duration:    QT Interval:    QTC Calculation:   R Axis:      Text Interpretation:         Radiology No results found.  Medications Ordered in ED Medications  albuterol (VENTOLIN HFA) 108 (90 Base) MCG/ACT inhaler 2 puff (has no administration in time range)  sodium chloride flush (NS) 0.9 % injection 10 mL (10 mLs Intravenous Given 04/09/23 2310)  lactated ringers bolus 500 mL (500 mLs Intravenous New Bag/Given 04/09/23 2306)  cefTRIAXone (ROCEPHIN) 1 g in sodium chloride 0.9 % 100 mL IVPB (has no administration in time range)  azithromycin (ZITHROMAX) 500 mg in dextrose 5 % 250 mL IVPB (has no administration in time range)   Procedures Procedures  (including critical care time) Medical Decision Making / ED Course   Medical Decision Making Risk Prescription drug management.    ***    Final Clinical Impression(s) / ED Diagnoses Final diagnoses:  None    This chart was dictated using voice recognition software.  Despite best efforts to proofread,  errors can occur which can change the documentation meaning.

## 2023-04-09 NOTE — ED Notes (Signed)
Patient placed on [facility] at this time due to SpO2 on Room Air 86-88%. SpO2 following oxygen administration is 90-91%.

## 2023-04-09 NOTE — ED Notes (Signed)
Pt put on 2L o2

## 2023-04-09 NOTE — ED Provider Triage Note (Signed)
Emergency Medicine Provider Triage Evaluation Note  Sara Boyer , a 82 y.o. female  was evaluated in triage.  Pt complains of cough of 1 week duration.  Saw her doctor 1 week ago, no medications added.  Is currently taking prednisone for temporal arteritis.  Review of Systems    Physical Exam  BP 117/64   Pulse 94   Temp 98 F (36.7 C)   Resp 20   Ht 5\' 1"  (1.549 m)   Wt 75.8 kg   SpO2 90%   BMI 31.55 kg/m  Gen:   Awake, no distress   Resp:  Normal effort, rhonchorous breath sounds bilaterally MSK:   Moves extremities without difficulty  Other:    Medical Decision Making  Medically screening exam initiated at 11:01 PM.  Appropriate orders placed.  Sara Boyer was informed that the remainder of the evaluation will be completed by another provider, this initial triage assessment does not replace that evaluation, and the importance of remaining in the ED until their evaluation is complete.  82 year old female here today with cough, fatigue.  Chest x-ray ordered.  Blood work, blood cultures and lactic acid ordered.  Will hold off on providing antibiotics until additional information obtained.  Patient's O2 saturation was in the room was 94%.  Rest of care as dictated by oncoming physician.   Sara Simmonds T, DO 04/09/23 2303

## 2023-04-09 NOTE — ED Triage Notes (Signed)
Pt reports SHOB, cough, and fever x1 week.

## 2023-04-09 NOTE — ED Provider Notes (Incomplete)
[place] EMERGENCY DEPARTMENT AT [facility] Provider Note  CSN: [id number] Arrival date & time: 04/09/23 2133  Chief Complaint(s) Cough, Fever, and Shortness of Breath  HPI Sara Boyer is a 82 y.o. female with a past medical history listed below who presents to the emergency department for worsening cough, shortness of breath and subjective fevers at home.  Patient is currently being treated for bronchitis with doxycycline diagnosed by her PCP 7 days ago.  She also endorses chest discomfort with coughing.  Endorses nausea without emesis.  No abdominal pain.  Reports intermittent lower extremity edema which has resolved today.  Reports that she has been compliant with her antibiotics but did not take today's dose due to loss of appetite.  Patient is also being treated for temporal arteritis with steroids that she has been on since July.  The history is provided by the patient.    Past Medical History Past Medical History:  Diagnosis Date  . Anemia    hx of as a child   . Arthritis   . Hypertension   . Pneumonia    hx of   . Shortness of breath    with exertion   Patient Active Problem List   Diagnosis Date Noted  . Temporal arteritis (HCC) 12/07/2022  . HTN (hypertension) 12/07/2022  . Bronchitis 12/07/2022  . Hypokalemia 12/07/2022  . Normocytic anemia 12/07/2022  . Vision changes 12/06/2022  . Hair loss 04/02/2018  . 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 Complete rotator cuff tear of left shoulder 01/24/2012   Home Medication(s) Prior to Admission medications   Medication Sig Start Date End Date Taking? Authorizing Provider  albuterol (VENTOLIN HFA) 108 (90 Base) MCG/ACT inhaler Inhale 2 puffs into the lungs every 6 (six) hours as needed for wheezing or shortness of breath. 04/02/23   Burchette, Elberta Fortis, MD  Ascorbic Acid (VITA-C PO) Take 1 tablet by mouth daily.    [provider]  chlorthalidone (HYGROTON) 25 MG tablet Take 25 mg by mouth daily.    [provider]  cholecalciferol (VITAMIN D) 1000 UNITS tablet Take 1,000 Units by mouth daily.    [provider]  doxycycline (VIBRAMYCIN) 100 MG capsule Take 1 capsule (100 mg total) by mouth 2 (two) times daily. 04/02/23   Burchette, Elberta Fortis, MD  Multiple Vitamins-Minerals (CENTRUM SILVER PO) Take 1 tablet by mouth daily.    [provider]  Multiple Vitamins-Minerals (ZINC PO) Take 1 tablet by mouth daily.    [provider]  OVER THE COUNTER MEDICATION Hair skin and nails-daily    [provider]  pantoprazole (PROTONIX) 40 MG tablet Take 1 tablet (40 mg total) by mouth daily. 12/10/22   Elgergawy, Leana Roe, MD  pantoprazole (PROTONIX) 40 MG tablet Take 1 tablet (40 mg total) by mouth daily. Switch for any other PPI at similar dose and frequency 01/10/23   Elgergawy, Leana Roe, MD  potassium chloride (KLOR-CON M) 10 MEQ tablet Take 10 mEq by mouth daily.    [provider]  predniSONE (DELTASONE) 10 MG tablet Take 5 tablets (50 mg total) by mouth daily. 12/09/22   Elgergawy, Leana Roe, MD  predniSONE (DELTASONE) 10 MG tablet Please take 50 mg oral daily from 12/29/2022 then decrease to 40 mg oral daily on 01/08/2023 12/29/22   Elgergawy, Leana Roe, MD  telmisartan (MICARDIS) 20 MG tablet Take 20 mg by mouth every morning. 11/06/22  [provider]  telmisartan (MICARDIS) 20 MG tablet Take 1 tablet (20 mg total) by mouth daily. 02/27/23   Karie Georges, MD                                                                                                                                    Allergies Amlodipine besylate, Lisinopril, Tetanus toxoids, and Strawberry extract  Review of Systems Review of Systems As noted in HPI  Physical Exam Vital Signs  I have reviewed the triage vital signs BP 117/64   Pulse 94   Temp 98 F (36.7 C)   Resp 20   Ht 5'  1" (1.549 m)   Wt 75.8 kg   SpO2 90%   BMI 31.55 kg/m  *** Physical Exam Vitals reviewed.  Constitutional:      General: She is not in acute distress.    Appearance: She is well-developed. She is not diaphoretic.  HENT:     Head: Normocephalic and atraumatic.     Nose: Nose normal.  Eyes:     General: No scleral icterus.       Right eye: No discharge.        Left eye: No discharge.     Conjunctiva/sclera: Conjunctivae normal.     Pupils: Pupils are equal, round, and reactive to light.  Cardiovascular:     Rate and Rhythm: Normal rate and regular rhythm.     Heart sounds: No murmur heard.    No friction rub. No gallop.  Pulmonary:     Effort: Pulmonary effort is normal. No respiratory distress.     Breath sounds: No stridor. Examination of the right-upper field reveals rales. Examination of the right-middle field reveals rales. Rales present.  Abdominal:     General: There is no distension.     Palpations: Abdomen is soft.     Tenderness: There is no abdominal tenderness.  Musculoskeletal:        General: No tenderness.     Cervical back: Normal range of motion and neck supple.  Skin:    General: Skin is warm and dry.     Findings: No erythema or rash.  Neurological:     Mental Status: She is alert and oriented to person, place, and time.     ED Results and Treatments Labs (all labs ordered are listed, but only abnormal results are displayed) Labs Reviewed  LACTIC ACID, PLASMA - Abnormal; Notable for the following components:      Result Value   Lactic Acid, Venous 3.3 (*)    All other components within normal limits  COMPREHENSIVE METABOLIC PANEL - Abnormal; Notable for the following components:   Potassium 2.5 (*)    Glucose, Bld 228 (*)    BUN 36 (*)    Total Protein 6.1 (*)    Alkaline Phosphatase 37 (*)    Total Bilirubin 1.2 (*)    GFR, Estimated 57 (*)  All other components within normal limits  CBC WITH DIFFERENTIAL/PLATELET - Abnormal; Notable for  the following components:   WBC 17.9 (*)    Neutro Abs 16.0 (*)    Abs Immature Granulocytes 0.10 (*)    All other components within normal limits  APTT - Abnormal; Notable for the following components:   aPTT 20 (*)    All other components within normal limits  RESP PANEL BY RT-PCR (RSV, FLU A&B, COVID)  RVPGX2  CULTURE, BLOOD (ROUTINE X 2)  CULTURE, BLOOD (ROUTINE X 2)  PROTIME-INR  LACTIC ACID, PLASMA  URINALYSIS, W/ REFLEX TO CULTURE (INFECTION SUSPECTED)                                                                                                                         EKG  EKG Interpretation Date/Time:  Monday April 09 2023 22:04:05 EST Ventricular Rate:  94 PR Interval:  189 QRS Duration:  109 QT Interval:  377 QTC Calculation: 472 R Axis:   98  Text Interpretation: Sinus rhythm Left posterior fascicular block Low voltage, precordial leads Abnormal R-wave progression, late transition Minimal ST elevation, inferior leads Confirmed by Drema Pry [phone]) on 04/09/2023 11:55:35 PM       Radiology No results found.  Medications Ordered in ED Medications  albuterol (VENTOLIN HFA) 108 (90 Base) MCG/ACT inhaler 2 puff (has no administration in time range)  sodium chloride flush (NS) 0.9 % injection 10 mL (10 mLs Intravenous Given 04/09/23 2310)  lactated ringers bolus 500 mL (500 mLs Intravenous New Bag/Given 04/09/23 2306)  cefTRIAXone (ROCEPHIN) 1 g in sodium chloride 0.9 % 100 mL IVPB (has no administration in time range)  azithromycin (ZITHROMAX) 500 mg in dextrose 5 % 250 mL IVPB (500 mg Intravenous New Bag/Given 04/09/23 2337)  potassium chloride 10 mEq in 100 mL IVPB (has no administration in time range)  potassium chloride SA (KLOR-CON M) CR tablet 40 mEq (has no administration in time range)   Procedures Procedures  (including critical care time) Medical Decision Making / ED Course   Medical Decision Making Amount and/or Complexity of Data  Reviewed Labs: ordered. Decision-making details documented in ED Course. Radiology: ordered and independent interpretation performed. Decision-making details documented in ED Course.  Risk Prescription drug management.    ***    Final Clinical Impression(s) / ED Diagnoses Final diagnoses:  Sepsis with acute hypoxic respiratory failure without septic shock, due to unspecified organism [facility])    This chart was dictated using voice recognition software.  Despite best efforts to proofread,  errors can occur which can change the documentation meaning.

## 2023-04-09 NOTE — Sepsis Progress Note (Signed)
Following for sepsis monitoring ?

## 2023-04-10 ENCOUNTER — Inpatient Hospital Stay (HOSPITAL_COMMUNITY): Payer: PPO

## 2023-04-10 DIAGNOSIS — J189 Pneumonia, unspecified organism: Secondary | ICD-10-CM | POA: Diagnosis present

## 2023-04-10 DIAGNOSIS — Z8739 Personal history of other diseases of the musculoskeletal system and connective tissue: Secondary | ICD-10-CM | POA: Diagnosis not present

## 2023-04-10 DIAGNOSIS — J9811 Atelectasis: Secondary | ICD-10-CM | POA: Diagnosis present

## 2023-04-10 DIAGNOSIS — Z888 Allergy status to other drugs, medicaments and biological substances status: Secondary | ICD-10-CM | POA: Diagnosis not present

## 2023-04-10 DIAGNOSIS — I10 Essential (primary) hypertension: Secondary | ICD-10-CM | POA: Diagnosis present

## 2023-04-10 DIAGNOSIS — E876 Hypokalemia: Secondary | ICD-10-CM | POA: Diagnosis present

## 2023-04-10 DIAGNOSIS — A419 Sepsis, unspecified organism: Secondary | ICD-10-CM | POA: Diagnosis present

## 2023-04-10 DIAGNOSIS — K219 Gastro-esophageal reflux disease without esophagitis: Secondary | ICD-10-CM | POA: Diagnosis present

## 2023-04-10 DIAGNOSIS — E860 Dehydration: Secondary | ICD-10-CM | POA: Diagnosis present

## 2023-04-10 DIAGNOSIS — T380X5A Adverse effect of glucocorticoids and synthetic analogues, initial encounter: Secondary | ICD-10-CM | POA: Diagnosis present

## 2023-04-10 DIAGNOSIS — J9601 Acute respiratory failure with hypoxia: Secondary | ICD-10-CM

## 2023-04-10 DIAGNOSIS — R739 Hyperglycemia, unspecified: Secondary | ICD-10-CM

## 2023-04-10 DIAGNOSIS — Z7952 Long term (current) use of systemic steroids: Secondary | ICD-10-CM | POA: Diagnosis not present

## 2023-04-10 DIAGNOSIS — J42 Unspecified chronic bronchitis: Secondary | ICD-10-CM | POA: Diagnosis present

## 2023-04-10 DIAGNOSIS — M316 Other giant cell arteritis: Secondary | ICD-10-CM | POA: Diagnosis present

## 2023-04-10 DIAGNOSIS — Z1152 Encounter for screening for COVID-19: Secondary | ICD-10-CM | POA: Diagnosis not present

## 2023-04-10 LAB — GLUCOSE, CAPILLARY
Glucose-Capillary: 122 mg/dL — ABNORMAL HIGH (ref 70–99)
Glucose-Capillary: 170 mg/dL — ABNORMAL HIGH (ref 70–99)
Glucose-Capillary: 166 mg/dL — ABNORMAL HIGH (ref 70–99)

## 2023-04-10 LAB — CBC
HCT: 33.5 % — ABNORMAL LOW (ref 36.0–46.0)
Hemoglobin: 11.8 g/dL — ABNORMAL LOW (ref 12.0–15.0)
MCH: 33.8 pg (ref 26.0–34.0)
MCHC: 35.2 g/dL (ref 30.0–36.0)
MCV: 96 fL (ref 80.0–100.0)
Platelets: 162 10*3/uL (ref 150–400)
RBC: 3.49 MIL/uL — ABNORMAL LOW (ref 3.87–5.11)
RDW: 15.2 % (ref 11.5–15.5)
WBC: 19.2 10*3/uL — ABNORMAL HIGH (ref 4.0–10.5)
nRBC: 0 % (ref 0.0–0.2)

## 2023-04-10 LAB — RESPIRATORY PANEL BY PCR

## 2023-04-10 LAB — BASIC METABOLIC PANEL
Anion gap: 12 (ref 5–15)
BUN: 28 mg/dL — ABNORMAL HIGH (ref 8–23)
CO2: 22 mmol/L (ref 22–32)
Calcium: 8.5 mg/dL — ABNORMAL LOW (ref 8.9–10.3)
Chloride: 104 mmol/L (ref 98–111)
Creatinine, Ser: 0.83 mg/dL (ref 0.44–1.00)
GFR, Estimated: >60 mL/min (ref >60)
Glucose, Bld: 133 mg/dL — ABNORMAL HIGH (ref 70–99)
Potassium: 3.1 mmol/L — ABNORMAL LOW (ref 3.5–5.1)
Sodium: 138 mmol/L (ref 135–145)

## 2023-04-10 LAB — EXPECTORATED SPUTUM ASSESSMENT W GRAM STAIN, RFLX TO RESP C

## 2023-04-10 LAB — MAGNESIUM: Magnesium: 1.5 mg/dL — ABNORMAL LOW (ref 1.7–2.4)

## 2023-04-10 LAB — PROCALCITONIN: Procalcitonin: 0.53 ng/mL

## 2023-04-10 LAB — HEMOGLOBIN A1C
Hgb A1c MFr Bld: 6.1 % — ABNORMAL HIGH (ref 4.8–5.6)
Mean Plasma Glucose: 128.37 mg/dL

## 2023-04-10 LAB — BRAIN NATRIURETIC PEPTIDE: B Natriuretic Peptide: 105.3 pg/mL — ABNORMAL HIGH (ref 0.0–100.0)

## 2023-04-10 LAB — LACTIC ACID, PLASMA: Lactic Acid, Venous: 2.8 mmol/L (ref 0.5–1.9)

## 2023-04-10 LAB — D-DIMER, QUANTITATIVE: D-Dimer, Quant: 3.73 ug{FEU}/mL — ABNORMAL HIGH (ref 0.00–0.50)

## 2023-04-10 MED ORDER — DEXTROSE 5 % IV SOLN
500.0000 mg | INTRAVENOUS | Status: DC
  Administered 2023-04-10: 500 mg via INTRAVENOUS
  Filled 2023-04-10: qty 5

## 2023-04-10 MED ORDER — SODIUM CHLORIDE 0.9 % IV SOLN: INTRAVENOUS | Status: DC | PRN

## 2023-04-10 MED ORDER — SODIUM CHLORIDE (PF) 0.9 % IJ SOLN
INTRAMUSCULAR | Status: AC
Start: 2023-04-10 — End: ?
  Filled 2023-04-10: qty 50

## 2023-04-10 MED ORDER — ALBUTEROL SULFATE (2.5 MG/3ML) 0.083% IN NEBU: 2.5000 mg | INHALATION_SOLUTION | RESPIRATORY_TRACT | Status: DC | PRN

## 2023-04-10 MED ORDER — IOHEXOL 350 MG/ML SOLN
100.0000 mL | Freq: Once | INTRAVENOUS | Status: AC | PRN
  Administered 2023-04-10: 100 mL via INTRAVENOUS

## 2023-04-10 MED ORDER — SODIUM CHLORIDE 0.9 % IV BOLUS
500.0000 mL | Freq: Once | INTRAVENOUS | Status: AC
Start: 2023-04-10 — End: 2023-04-10
  Administered 2023-04-10: 500 mL via INTRAVENOUS

## 2023-04-10 MED ORDER — POTASSIUM CHLORIDE CRYS ER 20 MEQ PO TBCR
40.0000 meq | EXTENDED_RELEASE_TABLET | ORAL | Status: AC
  Administered 2023-04-10 (×2): 40 meq via ORAL
  Filled 2023-04-10 (×2): qty 2

## 2023-04-10 MED ORDER — INSULIN ASPART 100 UNIT/ML IJ SOLN
0.0000 [IU] | Freq: Three times a day (TID) | INTRAMUSCULAR | Status: DC
  Administered 2023-04-10: 1 [IU] via SUBCUTANEOUS
  Administered 2023-04-10: 2 [IU] via SUBCUTANEOUS
  Administered 2023-04-11: 1 [IU] via SUBCUTANEOUS

## 2023-04-10 MED ORDER — DM-GUAIFENESIN ER 30-600 MG PO TB12
1.0000 | ORAL_TABLET | Freq: Two times a day (BID) | ORAL | Status: DC
  Administered 2023-04-10 – 2023-04-11 (×3): 1 via ORAL
  Filled 2023-04-10 (×3): qty 1

## 2023-04-10 MED ORDER — INSULIN ASPART 100 UNIT/ML IJ SOLN: 0.0000 [IU] | Freq: Every day | INTRAMUSCULAR | Status: DC

## 2023-04-10 MED ORDER — SODIUM CHLORIDE 0.9 % IV SOLN
1.0000 g | INTRAVENOUS | Status: DC
  Administered 2023-04-11: 1 g via INTRAVENOUS
  Filled 2023-04-10: qty 10

## 2023-04-10 MED ORDER — ENOXAPARIN SODIUM 40 MG/0.4ML IJ SOSY
40.0000 mg | PREFILLED_SYRINGE | INTRAMUSCULAR | Status: DC
  Administered 2023-04-10 – 2023-04-11 (×2): 40 mg via SUBCUTANEOUS
  Filled 2023-04-10 (×2): qty 0.4

## 2023-04-10 MED ORDER — PREDNISONE 5 MG PO TABS
15.0000 mg | ORAL_TABLET | Freq: Every day | ORAL | Status: DC
  Administered 2023-04-10 – 2023-04-11 (×2): 15 mg via ORAL
  Filled 2023-04-10 (×3): qty 1

## 2023-04-10 MED ORDER — MAGNESIUM SULFATE 2 GM/50ML IV SOLN
2.0000 g | Freq: Once | INTRAVENOUS | Status: AC
  Administered 2023-04-10: 2 g via INTRAVENOUS
  Filled 2023-04-10: qty 50

## 2023-04-10 NOTE — Plan of Care (Signed)

## 2023-04-10 NOTE — Progress Notes (Signed)
Sara Boyer is a 82 y.o. female with medical history significant of hypertension, arthritis, GERD, history of temporal arteritis in July 2024.  Recently seen by PCP on 11/4 for cough and wheezing.  She was given a dose of IM Depo-Medrol 80 mg and prescribed doxycycline x 7 days, Mucinex, and albuterol inhaler PRN for presumed bronchitis.  Patient presented to the ED complaining of shortness of breath and cough.  Oxygen saturation 86-88% on room air and placed on 2 L [place].  Was eventually diagnosed and admitted under hospitalist service after midnight today for acute hypoxic respiratory failure secondary to community-acquired pneumonia, started on Rocephin and Zithromax, bronchodilators.  Patient seen and examined.  She still complains of shortness of breath with no improvement.  Coughing in the room.  Has elevated D-dimer, after further inquiry, she endorsed pleuritic chest pain as well, raising concern for PE, will proceed with CTA chest.  Currently requiring 2 L of oxygen.  Check respiratory viral panel.  Also has low magnesium which I will replenish.  Procalcitonin 0.53.  Continue SSI for steroid-induced hyperglycemia, repeat hemoglobin A1c 6.1. On examination, has rhonchi bilaterally scattered. I spent 31 minutes reviewing the chart, evaluating patient, documenting and ordering necessary workup.

## 2023-04-10 NOTE — H&P (Signed)
History and Physical    Sara Boyer ZOX:[id number] DOB: 1940/09/30 DOA: 04/09/2023  PCP: Karie Georges, MD  Patient coming from: [facility] ED  Chief Complaint: Shortness of breath  HPI: Sara Boyer is a 82 y.o. female with medical history significant of hypertension, arthritis, GERD, history of temporal arteritis in July 2024.  Recently seen by PCP on 11/4 for cough and wheezing.  She was given a dose of IM Depo-Medrol 80 mg and prescribed doxycycline x 7 days, Mucinex, and albuterol inhaler PRN for presumed bronchitis.  Patient presented to the ED today complaining of shortness of breath and cough.  Oxygen saturation 86-88% on room air and placed on 2 L [place].  Blood pressure intermittently soft with systolic in the 90s.  Afebrile.  Labs notable for WBC count 17.9, potassium 2.5, glucose 228, BUN 36, creatinine 0.9, lactic acid 3.3> 2.8, COVID/influenza/RSV PCR negative, blood cultures collected.  Chest x-ray showing low lung volumes with mild bibasilar atelectasis (left greater than right) and small left pleural effusion. Patient was given oral potassium 40 mEq, IV potassium 10 mEq x 3, ceftriaxone, azithromycin, and 1 L IV fluids.  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  A week ago she started having cough and was seen by her PCP and prescribed doxycycline which she has been taking and only has 1 tablet left.  She was also using albuterol inhaler.  However, her cough continued to worsen and she has also developed mild shortness of breath.  In addition, having rhinorrhea.  Reports low-grade fever.  Her chest hurts only when she coughs but not otherwise.  Denies history of blood clots or recent travel.  Patient states she had temporal arteritis back in July and since then has been on a prednisone taper.  She is currently on prednisone 15 mg daily until she follows up with her rheumatologist.  She is requesting her  home prednisone as she missed her dose last night.  Review of Systems:  Review of Systems  All other systems reviewed and are negative.   Past Medical History:  Diagnosis Date   Anemia    hx of as a child    Arthritis    Hypertension    Pneumonia    hx of    Shortness of breath    with exertion    Past Surgical History:  Procedure Laterality Date   ABDOMINAL HYSTERECTOMY     APPENDECTOMY     ARTERY BIOPSY Left 12/11/2022   Procedure: LEFT BIOPSY TEMPORAL ARTERY;  Surgeon: Victorino Sparrow, MD;  Location: [facility] OR;  Service: Vascular;  Laterality: Left;   BREAST CYST EXCISION Right    BREAST SURGERY     right breast surgery for benign lump    corrective surgery to open fallopian tubes      DILATION AND CURETTAGE OF UTERUS     several    HEMORRHOID SURGERY     lobe removed from thyroid      SHOULDER OPEN ROTATOR CUFF REPAIR  01/24/2012   Procedure: ROTATOR CUFF REPAIR SHOULDER OPEN;  Surgeon: Jacki Cones, MD;  Location: [facility] ORS;  Service: Orthopedics;  Laterality: Left;  Left Shoulder Rotator Cuff Repair with Graft and Anchors and acrominectomy   TONSILLECTOMY       reports that she has never smoked. She has never used smokeless tobacco. She reports current alcohol use. She reports that she does  not use drugs.  Allergies  Allergen Reactions   Amlodipine Besylate Other (See Comments)   Lisinopril Other (See Comments)   Tetanus Toxoids    Strawberry Extract Rash and Other (See Comments)    burning when touched on skin    Family History  Problem Relation Age of Onset   Breast cancer Cousin 39       again @ 15    Prior to Admission medications   Medication Sig Start Date End Date Taking? Authorizing Provider  albuterol (VENTOLIN HFA) 108 (90 Base) MCG/ACT inhaler Inhale 2 puffs into the lungs every 6 (six) hours as needed for wheezing or shortness of breath. 04/02/23   Burchette, Elberta Fortis, MD  Ascorbic Acid (VITA-C PO) Take 1 tablet by mouth daily.    [provider]  chlorthalidone (HYGROTON) 25 MG tablet Take 25 mg by mouth daily.    [provider]  cholecalciferol (VITAMIN D) 1000 UNITS tablet Take 1,000 Units by mouth daily.    [provider]  doxycycline (VIBRAMYCIN) 100 MG capsule Take 1 capsule (100 mg total) by mouth 2 (two) times daily. 04/02/23   Burchette, Elberta Fortis, MD  Multiple Vitamins-Minerals (CENTRUM SILVER PO) Take 1 tablet by mouth daily.    [provider]  Multiple Vitamins-Minerals (ZINC PO) Take 1 tablet by mouth daily.    [provider]  OVER THE COUNTER MEDICATION Hair skin and nails-daily    [provider]  pantoprazole (PROTONIX) 40 MG tablet Take 1 tablet (40 mg total) by mouth daily. 12/10/22   Elgergawy, Leana Roe, MD  pantoprazole (PROTONIX) 40 MG tablet Take 1 tablet (40 mg total) by mouth daily. Switch for any other PPI at similar dose and frequency 01/10/23   Elgergawy, Leana Roe, MD  potassium chloride (KLOR-CON M) 10 MEQ tablet Take 10 mEq by mouth daily.    [provider]  predniSONE (DELTASONE) 10 MG tablet Take 5 tablets (50 mg total) by mouth daily. 12/09/22   Elgergawy, Leana Roe, MD  predniSONE (DELTASONE) 10 MG tablet Please take 50 mg oral daily from 12/29/2022 then decrease to 40 mg oral daily on 01/08/2023 12/29/22   Elgergawy, Leana Roe, MD  telmisartan (MICARDIS) 20 MG tablet Take 20 mg by mouth every morning. 11/06/22   [provider]  telmisartan (MICARDIS) 20 MG tablet Take 1 tablet (20 mg total) by mouth daily. 02/27/23   Karie Georges, MD    Physical Exam: Vitals:   04/10/23 0230 04/10/23 0245 04/10/23 0434 04/10/23 0442  BP: (!) 112/53 (!) 121/54  (!) 116/57  Pulse: 87 88  80  Resp: 12 13  15   Temp:    98.2 F (36.8 C)  TempSrc:    Oral  SpO2: 95% 95%  94%  Weight:   77.6 kg   Height:   5\' 1"  (1.549 m)     Physical Exam Vitals reviewed.  Constitutional:      General: She is not in acute distress.    Appearance: She is  ill-appearing.  HENT:     Head: Normocephalic and atraumatic.  Eyes:     Extraocular Movements: Extraocular movements intact.  Cardiovascular:     Rate and Rhythm: Normal rate and regular rhythm.     Pulses: Normal pulses.  Pulmonary:     Effort: Pulmonary effort is normal. No respiratory distress.     Breath sounds: Rhonchi and rales present.  Abdominal:     General: Bowel sounds are normal. There is  no distension.     Palpations: Abdomen is soft.     Tenderness: There is no abdominal tenderness. There is no guarding.  Musculoskeletal:     Cervical back: Normal range of motion.     Right lower leg: No edema.     Left lower leg: No edema.  Skin:    General: Skin is warm and dry.  Neurological:     General: No focal deficit present.     Mental Status: She is alert and oriented to person, place, and time.     Labs on Admission: I have personally reviewed following labs and imaging studies  CBC: Recent Labs  Lab 04/09/23 2208  WBC 17.9*  NEUTROABS 16.0*  HGB 13.3  HCT 37.6  MCV 93.8  PLT 183   Basic Metabolic Panel: Recent Labs  Lab 04/09/23 2208  NA 138  K 2.5*  CL 98  CO2 28  GLUCOSE 228*  BUN 36*  CREATININE 0.98  CALCIUM 8.9   GFR: Estimated Creatinine Clearance: 41 mL/min (by C-G formula based on SCr of 0.98 mg/dL). Liver Function Tests: Recent Labs  Lab 04/09/23 2208  AST 16  ALT 35  ALKPHOS 37*  BILITOT 1.2*  PROT 6.1*  ALBUMIN 4.1   No results for input(s): "LIPASE", "AMYLASE" in the last 168 hours. No results for input(s): "AMMONIA" in the last 168 hours. Coagulation Profile: Recent Labs  Lab 04/09/23 2208  INR 1.0   Cardiac Enzymes: No results for input(s): "CKTOTAL", "CKMB", "CKMBINDEX", "TROPONINI" in the last 168 hours. BNP (last 3 results) No results for input(s): "PROBNP" in the last 8760 hours. HbA1C: No results for input(s): "HGBA1C" in the last 72 hours. CBG: No results for input(s): "GLUCAP" in the last 168 hours. Lipid  Profile: No results for input(s): "CHOL", "HDL", "LDLCALC", "TRIG", "CHOLHDL", "LDLDIRECT" in the last 72 hours. Thyroid Function Tests: No results for input(s): "TSH", "T4TOTAL", "FREET4", "T3FREE", "THYROIDAB" in the last 72 hours. Anemia Panel: No results for input(s): "VITAMINB12", "FOLATE", "FERRITIN", "TIBC", "IRON", "RETICCTPCT" in the last 72 hours. Urine analysis:    Component Value Date/Time   COLORURINE YELLOW 01/19/2012 1256   APPEARANCEUR CLEAR 01/19/2012 1256   LABSPEC 1.020 01/19/2012 1256   PHURINE 6.0 01/19/2012 1256   GLUCOSEU NEGATIVE 01/19/2012 1256   HGBUR NEGATIVE 01/19/2012 1256   BILIRUBINUR NEGATIVE 01/19/2012 1256   KETONESUR NEGATIVE 01/19/2012 1256   PROTEINUR NEGATIVE 01/19/2012 1256   UROBILINOGEN 0.2 01/19/2012 1256   NITRITE NEGATIVE 01/19/2012 1256   LEUKOCYTESUR NEGATIVE 01/19/2012 1256    Radiological Exams on Admission: DG Chest Port 1 View  Result Date: 04/10/2023 CLINICAL DATA:  Shortness of breath. EXAM: PORTABLE CHEST 1 VIEW COMPARISON:  December 04, 2022 FINDINGS: The heart size and mediastinal contours are within normal limits. There is mild calcification of the aortic arch. Low lung volumes are noted. Mild atelectasis is seen within the bilateral lung bases, left greater than right. A small left pleural effusion is suspected. No pneumothorax is identified. Multilevel degenerative changes seen throughout the thoracic spine. IMPRESSION: 1. Low lung volumes with mild bibasilar atelectasis, left greater than right. 2. Small left pleural effusion. Electronically Signed   By: Aram Candela M.D.   On: 04/10/2023 02:06    EKG: Independently reviewed.  Sinus rhythm, low voltage.  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  Prescribed doxycycline a week ago by PCP which she has been taking without much improvement.  In the ED, oxygen saturation noted to be 86-88% on room air.  Currently satting well on 2 L [place], no respiratory distress.  Does have leukocytosis but does not meet any other SIRS criteria.  Lactic acidosis likely due to dehydration, improved after IV fluids.  Blood pressure was intermittently soft in the ED but now improved after IV fluids.  Chest x-ray showing low lung volumes with mild bibasilar atelectasis (left greater than right) and small left pleural effusion.  COVID/influenza/RSV PCR negative.  Patient's history and symptoms highly suspicious for respiratory infection/pneumonia.  PE less likely but will check stat D-dimer, if elevated, order CTA chest to rule out PE otherwise order CT without contrast to assess for possible pneumonia.  Continue ceftriaxone and azithromycin, Mucinex twice daily, and albuterol as needed.  Respiratory viral panel ordered.  Check procalcitonin level and BNP.  Sputum Gram stain and culture.  Trend WBC count.  Follow-up blood cultures.  Continue supplemental oxygen, wean as tolerated.  Hypokalemia Likely due to poor p.o. intake in the setting of illness.  No vomiting or diarrhea reported.  Monitor potassium and magnesium levels, continue to replace as needed.  History of temporal arteritis in July 2024 Patient is seen by rheumatology and is on a prednisone taper since then.  Continue prednisone and outpatient follow-up with rheumatology.  Hyperglycemia Likely steroid-induced.  Glucose in the 220s.  Last A1c was 5.7 in July 2024, repeat ordered.  Continue CBG checks ACHS/sensitive sliding scale insulin as needed.  Hypertension Avoid antihypertensives at this time.  DVT prophylaxis: Lovenox Code Status: Full Code (discussed with the patient) Family Communication: Husband sleeping at bedside. Level of care: Telemetry  bed Admission status: It is my clinical opinion that admission to INPATIENT is reasonable and necessary because of the expectation that this patient will require hospital care that crosses at least 2 midnights to treat this condition based on the medical complexity of the problems presented.  Given the aforementioned information, the predictability of an adverse outcome is felt to be significant.  John Giovanni MD [facility]  If 7PM-7AM, please contact night-coverage [email or web address]  04/10/2023, 5:44 AM

## 2023-04-10 NOTE — Progress Notes (Signed)
Patient arrived to the unit from DB. Patient alert and oriented x4. Patient oriented to room and unit. No questions at this time from patient. Family member at bedside. Patient belongings within reach. Bed in the lowest position and call light within reach.

## 2023-04-10 NOTE — Progress Notes (Signed)
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 Presented with persistent symptoms. Requiring 2L O2 for desat to 86% on RA. Intermittent borderline hypotension,. Other VS wnl. WBC 17, lactate 3.3 -> 2.8. Bcx obtained. Flu / COVID / rsv neg. CXR with low volumes, bibasilar atelectasis L>R, small effusion on L. Treated with Ceftriaxone, azithromycin. 500 cc IVF. Getting Kcl. Advised to give additional 500 cc - 1L of IVF. Repeat lactate.   Plan of care: The patient is accepted for admission to Telemetry unit, at [facility] or [facility]   Author: Dolly Rias, MD 04/10/2023  Check [email or web address] for on-call coverage.  Nursing staff, Please call [facility] Admits & Consults System-Wide number on Amion as soon as patient's arrival, so appropriate admitting provider can evaluate the pt.

## 2023-04-10 NOTE — Plan of Care (Signed)
  Problem: Education: Goal: Knowledge of General Education information will improve Description: Including pain rating scale, medication(s)/side effects and non-pharmacologic comfort measures Outcome: Progressing   Problem: Clinical Measurements: Goal: Ability to maintain clinical measurements within normal limits will improve Outcome: Progressing   Problem: Clinical Measurements: Goal: Diagnostic test results will improve Outcome: Progressing   Problem: Clinical Measurements: Goal: Respiratory complications will improve Outcome: Progressing   Problem: Education: Goal: Ability to describe self-care measures that may prevent or decrease complications (Diabetes Survival Skills Education) will improve Outcome: Progressing

## 2023-04-11 DIAGNOSIS — I10 Essential (primary) hypertension: Secondary | ICD-10-CM

## 2023-04-11 DIAGNOSIS — J189 Pneumonia, unspecified organism: Secondary | ICD-10-CM | POA: Diagnosis not present

## 2023-04-11 DIAGNOSIS — Z8739 Personal history of other diseases of the musculoskeletal system and connective tissue: Secondary | ICD-10-CM

## 2023-04-11 DIAGNOSIS — R739 Hyperglycemia, unspecified: Secondary | ICD-10-CM

## 2023-04-11 DIAGNOSIS — J9601 Acute respiratory failure with hypoxia: Secondary | ICD-10-CM | POA: Diagnosis not present

## 2023-04-11 DIAGNOSIS — E876 Hypokalemia: Secondary | ICD-10-CM

## 2023-04-11 LAB — COMPREHENSIVE METABOLIC PANEL
ALT: 35 U/L (ref 0–44)
AST: 20 U/L (ref 15–41)
Albumin: 3.7 g/dL (ref 3.5–5.0)
Alkaline Phosphatase: 43 U/L (ref 38–126)
Anion gap: 11 (ref 5–15)
BUN: 23 mg/dL (ref 8–23)
CO2: 27 mmol/L (ref 22–32)
Calcium: 9.1 mg/dL (ref 8.9–10.3)
Chloride: 102 mmol/L (ref 98–111)
Creatinine, Ser: 0.75 mg/dL (ref 0.44–1.00)
GFR, Estimated: >60 mL/min (ref >60)
Glucose, Bld: 102 mg/dL — ABNORMAL HIGH (ref 70–99)
Potassium: 3 mmol/L — ABNORMAL LOW (ref 3.5–5.1)
Sodium: 140 mmol/L (ref 135–145)
Total Bilirubin: 1 mg/dL (ref <1.2)
Total Protein: 6.3 g/dL — ABNORMAL LOW (ref 6.5–8.1)

## 2023-04-11 LAB — CBC WITH DIFFERENTIAL/PLATELET
Abs Immature Granulocytes: 0.12 10*3/uL — ABNORMAL HIGH (ref 0.00–0.07)
Basophils Absolute: 0.1 10*3/uL (ref 0.0–0.1)
Basophils Relative: 0 %
Eosinophils Absolute: 0.1 10*3/uL (ref 0.0–0.5)
Eosinophils Relative: 1 %
HCT: 38.4 % (ref 36.0–46.0)
Hemoglobin: 13.3 g/dL (ref 12.0–15.0)
Immature Granulocytes: 1 %
Lymphocytes Relative: 9 %
Lymphs Abs: 1.4 10*3/uL (ref 0.7–4.0)
MCH: 33.3 pg (ref 26.0–34.0)
MCHC: 34.6 g/dL (ref 30.0–36.0)
MCV: 96.2 fL (ref 80.0–100.0)
Monocytes Absolute: 0.8 10*3/uL (ref 0.1–1.0)
Monocytes Relative: 5 %
Neutro Abs: 12.7 10*3/uL — ABNORMAL HIGH (ref 1.7–7.7)
Neutrophils Relative %: 84 %
Platelets: 178 10*3/uL (ref 150–400)
RBC: 3.99 MIL/uL (ref 3.87–5.11)
RDW: 15 % (ref 11.5–15.5)
WBC: 15.2 10*3/uL — ABNORMAL HIGH (ref 4.0–10.5)
nRBC: 0 % (ref 0.0–0.2)

## 2023-04-11 LAB — GLUCOSE, CAPILLARY
Glucose-Capillary: 98 mg/dL (ref 70–99)
Glucose-Capillary: 141 mg/dL — ABNORMAL HIGH (ref 70–99)

## 2023-04-11 LAB — MAGNESIUM: Magnesium: 1.7 mg/dL (ref 1.7–2.4)

## 2023-04-11 MED ORDER — COMPACT SPACE CHAMBER DEVI: 0 refills | Status: DC

## 2023-04-11 MED ORDER — CEFDINIR 300 MG PO CAPS: 300.0000 mg | ORAL_CAPSULE | Freq: Two times a day (BID) | ORAL | 0 refills | Status: AC

## 2023-04-11 MED ORDER — AZITHROMYCIN 250 MG PO TABS: 500.0000 mg | ORAL_TABLET | Freq: Every day | ORAL | Status: DC

## 2023-04-11 MED ORDER — ALBUTEROL SULFATE HFA 108 (90 BASE) MCG/ACT IN AERS
2.0000 | INHALATION_SPRAY | RESPIRATORY_TRACT | 1 refills | Status: DC | PRN
Start: 2023-04-11 — End: 2023-11-20

## 2023-04-11 MED ORDER — AZITHROMYCIN 500 MG PO TABS: 500.0000 mg | ORAL_TABLET | Freq: Every day | ORAL | 0 refills | Status: AC

## 2023-04-11 MED ORDER — PREDNISONE 5 MG PO TABS: 15.0000 mg | ORAL_TABLET | Freq: Every day | ORAL | Status: DC

## 2023-04-11 NOTE — Hospital Course (Addendum)
82 y.o. female with medical history significant of hypertension, arthritis, GERD, history of temporal arteritis in July 2024 who presented to the ED complaining of shortness of breath and cough despite receiving an outpatient course of doxycycline.    Upon evaluation in the emergency department patient was found to be saturating between 86 and 88% on room air and was therefore placed on supplemental oxygen at 2 L  via nasal cannula. Chest x-ray showed low lung volumes with mild bibasilar atelectasis (left greater than right) and small left pleural effusion with concerns for bibasilar pneumonia.  The hospitalist group was then called to assess the patient for admission to the hospital.  Patient was initiated on intravenous fluids as well as intravenous ceftriaxone and azithromycin.  Electrolyte abnormalities including hypokalemia were corrected.  Over the course of the hospitalization patient rapidly clinically improved.  Upon thorough review of the patient's CT imaging is significant amount of mucous plugging was noted and patient was advised to discuss obtaining a pulmonary function test or pulmonology referral with her primary care provider upon follow-up.  Patient was discharged home in improved and stable condition on Omnicef and azithromycin on 04/19/2023.

## 2023-04-11 NOTE — Discharge Instructions (Signed)
Please take all prescribed medications exactly as instructed including the entire course of your antibiotics Omnicef and Azithromycin.   You have also been prescribed an inhaler to be used in conjunction with a spacer every 4 hours as needed for shortness of breath or wheezing. Please consume a low sodium diet Please increase your physical activity as tolerated. Please maintain all outpatient follow-up appointments including follow-up with your primary care provider. A significant amount of mucous plugging was identified on your CT imaging of your chest.  Speak to your primary care provider about potentially having pulmonary function testing or a referral to pulmonology in the outpatient setting. Please return to the emergency department if you develop worsening shortness of breath, fevers in excess of 100.4 F, weakness or inability to tolerate oral intake.

## 2023-04-11 NOTE — Plan of Care (Signed)

## 2023-04-11 NOTE — Plan of Care (Signed)

## 2023-04-11 NOTE — Progress Notes (Signed)
   04/11/23 1604  TOC Brief Assessment  Insurance and Status Reviewed  Patient has primary care physician Yes  Home environment has been reviewed Home  Prior level of function: independent  Prior/Current Home Services No current home services  Social Determinants of Health Reivew SDOH reviewed no interventions necessary  Readmission risk has been reviewed Yes  Transition of care needs no transition of care needs at this time

## 2023-04-11 NOTE — Progress Notes (Signed)
Mobility Specialist - Progress Note   04/11/23 1357  Mobility  Activity Ambulated with assistance in hallway  Level of Assistance Standby assist, set-up cues, supervision of patient - no hands on  Assistive Device None  Distance Ambulated (ft) 500 ft  Range of Motion/Exercises Active  Activity Response Tolerated well  Mobility Referral Yes  $Mobility charge 1 Mobility  Mobility Specialist Start Time (ACUTE ONLY) 1339  Mobility Specialist Stop Time (ACUTE ONLY) 1347  Mobility Specialist Time Calculation (min) (ACUTE ONLY) 8 min   Received in chair and agreed to mobility.   On room air, pt SpO2% was 96% when standing.  During ambulation SpO2% dropped to 95%. Did not get lower.  Returned to chair with all needs met, on room air per nurse request.  Marilynne Halsted Mobility Specialist

## 2023-04-11 NOTE — Progress Notes (Signed)
Per MD request PT was instructed on how to utilize [employer] Spacer for home usage.

## 2023-04-15 LAB — CULTURE, BLOOD (ROUTINE X 2)
Culture: NO GROWTH
Culture: NO GROWTH

## 2023-04-18 ENCOUNTER — Ambulatory Visit: Payer: PPO | Admitting: Family Medicine

## 2023-04-18 ENCOUNTER — Encounter: Payer: Self-pay | Admitting: Family Medicine

## 2023-04-18 VITALS — BP 140/70 | HR 88 | Temp 98.0°F | Ht 61.0 in | Wt 171.0 lb

## 2023-04-18 DIAGNOSIS — D72829 Elevated white blood cell count, unspecified: Secondary | ICD-10-CM

## 2023-04-18 DIAGNOSIS — E876 Hypokalemia: Secondary | ICD-10-CM

## 2023-04-18 DIAGNOSIS — J189 Pneumonia, unspecified organism: Secondary | ICD-10-CM

## 2023-04-18 LAB — CBC WITH DIFFERENTIAL/PLATELET
Basophils Absolute: 0 10*3/uL (ref 0.0–0.1)
Basophils Relative: 0.5 % (ref 0.0–3.0)
Eosinophils Absolute: 0 10*3/uL (ref 0.0–0.7)
Eosinophils Relative: 0.2 % (ref 0.0–5.0)
HCT: 38 % (ref 36.0–46.0)
Hemoglobin: 13.2 g/dL (ref 12.0–15.0)
Lymphocytes Relative: 14.6 % (ref 12.0–46.0)
Lymphs Abs: 1.5 10*3/uL (ref 0.7–4.0)
MCHC: 34.8 g/dL (ref 30.0–36.0)
MCV: 96.8 fL (ref 78.0–100.0)
Monocytes Absolute: 0.7 10*3/uL (ref 0.1–1.0)
Monocytes Relative: 6.8 % (ref 3.0–12.0)
Neutro Abs: 7.8 10*3/uL — ABNORMAL HIGH (ref 1.4–7.7)
Neutrophils Relative %: 77.9 % — ABNORMAL HIGH (ref 43.0–77.0)
Platelets: 212 10*3/uL (ref 150.0–400.0)
RBC: 3.92 Mil/uL (ref 3.87–5.11)
RDW: 15.1 % (ref 11.5–15.5)
WBC: 10 10*3/uL (ref 4.0–10.5)

## 2023-04-18 LAB — BASIC METABOLIC PANEL
BUN: 27 mg/dL — ABNORMAL HIGH (ref 6–23)
CO2: 28 meq/L (ref 19–32)
Calcium: 9.1 mg/dL (ref 8.4–10.5)
Chloride: 105 meq/L (ref 96–112)
Creatinine, Ser: 0.89 mg/dL (ref 0.40–1.20)
GFR: 59.71 mL/min — ABNORMAL LOW (ref >60.00)
Glucose, Bld: 146 mg/dL — ABNORMAL HIGH (ref 70–99)
Potassium: 3 meq/L — ABNORMAL LOW (ref 3.5–5.1)
Sodium: 140 meq/L (ref 135–145)

## 2023-04-18 NOTE — Progress Notes (Signed)
Established Patient Office Visit  Subjective   Patient ID: Sara Boyer, female    DOB: 01/21/1941  Age: 82 y.o. MRN: [id number]  Chief Complaint  Patient presents with   Hospitalization Follow-up    HPI   Sara Boyer is seen for hospital follow-up.  Her chronic medical problems include history of hypertension, temporal arteritis, history of hypokalemia.  He was diagnosed with temporal arteritis this past summer and currently on oral prednisone.  Followed by rheumatology for that.  She had been seen here November 4 with 2-day history of productive cough.  Afebrile at that time.  Testing for influenza, COVID, and strep negative at that time.  She had evidence for some reactive airway findings but no respiratory distress and was given Depo-Medrol 80 mg IM's, started doxycycline 100 mg twice daily, and albuterol as needed.  She presented to the ER day of admission with increased shortness of breath and cough.  O2 sats 86 to 88% on room air.  She was afebrile.  White count elevation of 17,000 but on chronic prednisone.  Potassium 2.5.  COVID testing again negative.  Chest x-ray showed no definite pneumonia and small left pleural effusion.  Subsequent CT angiogram of chest showed no PE but did show evidence for some probable pneumonia bilaterally.  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  A1c was repeated in hospital and 6.1.  Discharge potassium still low at 3.0.  She was told that she needed follow-up with pulmonary.  She feels like she is mostly back to baseline at this time but not quite.  Still has some cough but mostly dry.  She states her  right ear feels "clogged up ".  No recurrent fever.  Past Medical History:  Diagnosis Date   Anemia    hx of as a child    Arthritis    Hypertension    Pneumonia    hx of    Shortness of breath    with exertion   Past Surgical History:  Procedure Laterality Date   ABDOMINAL HYSTERECTOMY     APPENDECTOMY     ARTERY BIOPSY Left 12/11/2022   Procedure: LEFT BIOPSY TEMPORAL ARTERY;  Surgeon: Victorino Sparrow, MD;  Location: [facility] OR;  Service: Vascular;  Laterality: Left;   BREAST CYST EXCISION Right    BREAST SURGERY     right breast surgery for benign lump    corrective surgery to open fallopian tubes      DILATION AND CURETTAGE OF UTERUS     several    HEMORRHOID SURGERY     lobe removed from thyroid      SHOULDER OPEN ROTATOR CUFF REPAIR  01/24/2012   Procedure: ROTATOR CUFF REPAIR SHOULDER OPEN;  Surgeon: Jacki Cones, MD;  Location: [facility] ORS;  Service: Orthopedics;  Laterality: Left;  Left Shoulder Rotator Cuff Repair with Graft and Anchors and acrominectomy   TONSILLECTOMY      reports that she has never smoked. She has never used smokeless tobacco. She reports current alcohol use. She reports that she does not use drugs. family history includes Breast cancer (age of onset: 83) in her cousin. Allergies  Allergen Reactions   Amlodipine Besylate Other (See Comments)   Lisinopril Other (See Comments)   Tetanus Toxoids    Strawberry Extract Rash and Other (See Comments)    burning when touched on skin    Review of Systems  Constitutional:  Negative for chills and fever.  Eyes:  Negative for blurred vision.  Respiratory:  Positive for cough. Negative for hemoptysis, sputum production, shortness of breath and wheezing.   Cardiovascular:  Negative for chest pain.  Gastrointestinal:  Negative for abdominal pain.  Genitourinary:  Negative for dysuria.  Neurological:  Negative for dizziness, weakness and headaches.      Objective:     BP (!) 140/70   Pulse 88   Temp 98  F (36.7 C) (Oral)   Ht 5\' 1"  (1.549 m)   Wt 171 lb (77.6 kg)   SpO2 98%   BMI 32.31 kg/m  BP Readings from Last 3 Encounters:  04/18/23 (!) 140/70  04/11/23 (!) 126/58  04/03/23 (!) 144/60   Wt Readings from Last 3 Encounters:  04/18/23 171 lb (77.6 kg)  04/10/23 171 lb 1.2 oz (77.6 kg)  04/02/23 167 lb 4.8 oz (75.9 kg)      Physical Exam Vitals reviewed.  Constitutional:      General: She is not in acute distress.    Appearance: She is not ill-appearing or toxic-appearing.  HENT:     Right Ear: Tympanic membrane normal.     Left Ear: Tympanic membrane normal.     Mouth/Throat:     Mouth: Mucous membranes are moist.  Cardiovascular:     Rate and Rhythm: Normal rate and regular rhythm.  Pulmonary:     Effort: Pulmonary effort is normal.     Breath sounds: Normal breath sounds. No wheezing.  Musculoskeletal:     Cervical back: Neck supple.  Neurological:     Mental Status: She is alert.      No results found for any visits on 04/18/23.  Last CBC Lab Results  Component Value Date   WBC 15.2 (H) 04/11/2023   HGB 13.3 04/11/2023   HCT 38.4 04/11/2023   MCV 96.2 04/11/2023   MCH 33.3 04/11/2023   RDW 15.0 04/11/2023   PLT 178 04/11/2023   Last metabolic panel Lab Results  Component Value Date   GLUCOSE 102 (H) 04/11/2023   NA 140 04/11/2023   K 3.0 (L) 04/11/2023   CL 102 04/11/2023   CO2 27 04/11/2023   BUN 23 04/11/2023   CREATININE 0.75 04/11/2023   GFRNONAA >60 04/11/2023   CALCIUM 9.1 04/11/2023   PHOS 3.3 12/07/2022   PROT 6.3 (L) 04/11/2023   ALBUMIN 3.7 04/11/2023   BILITOT 1.0 04/11/2023   ALKPHOS 43 04/11/2023   AST 20 04/11/2023   ALT 35 04/11/2023   ANIONGAP 11 04/11/2023   Last lipids Lab Results  Component Value Date   CHOL 160 12/07/2022   HDL 32 (L) 12/07/2022   LDLCALC 111 (H) 12/07/2022   TRIG 83 12/07/2022   CHOLHDL 5.0 12/07/2022   Last hemoglobin A1c Lab Results  Component Value Date   HGBA1C 6.1 (H) 04/10/2023       The ASCVD Risk score (Arnett DK, et al., 2019) failed to calculate for the following reasons:   The 2019 ASCVD risk score is only valid for ages 41 to 53    Assessment & Plan:   #68 82 year old female with recent hospitalization for community-acquired pneumonia.  Now off antibiotics.  Other than some mild  residual cough but feels like she is improving.  She had increased risk factors of age and chronic prednisone use secondary to temporal arteritis.  Did have significant leukocytosis but partly related perhaps to her chronic prednisone.  Recheck CBC today. 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  -Hospitalist had recommended follow-up with pulmonary regarding above issues and we will set this up  #2 history of temporal arteritis currently on tapering dose of prednisone per rheumatology.  #3 hypokalemia.  She had severe hypokalemia with potassium 2.5 initially when hospitalized.  Takes potassium supplement.  Is on chlorthalidone.  Recheck basic metabolic panel today.   No follow-ups on file.    Evelena Peat, MD

## 2023-04-18 NOTE — Patient Instructions (Signed)
Follow up for any fever or increased shortness of breath  We will call with lab results  Will be setting up pulmonary referral.

## 2023-04-19 MED ORDER — POTASSIUM CHLORIDE CRYS ER 10 MEQ PO TBCR: EXTENDED_RELEASE_TABLET | ORAL | 0 refills | Status: DC

## 2023-04-19 NOTE — Addendum Note (Signed)
Addended by: Christy Sartorius on: 04/19/2023 08:51 AM   Modules accepted: Orders

## 2023-04-19 NOTE — Discharge Summary (Signed)
Physician Discharge Summary   Patient: Sara Boyer MRN: [id number] DOB: [birth year]  Admit date:     04/09/2023  Discharge date: 04/11/2023  Discharge Physician: Marinda Elk   PCP: Karie Georges, MD   Recommendations at discharge:   Please take all prescribed medications exactly as instructed including the entire course of your antibiotics Omnicef and Azithromycin.   You have also been prescribed an inhaler to be used in conjunction with a spacer every 4 hours as needed for shortness of breath or wheezing. Please consume a low sodium diet Please increase your physical activity as tolerated. Please maintain all outpatient follow-up appointments including follow-up with your primary care provider. A significant amount of mucous plugging was identified on your CT imaging of your chest.  Speak to your primary care provider about potentially having pulmonary function testing or a referral to pulmonology in the outpatient setting. Please return to the emergency department if you develop worsening shortness of breath, fevers in excess of 100.4 F, weakness or inability to tolerate oral intake.  Discharge Diagnoses: Principal Problem:   CAP (community acquired pneumonia) Active Problems:   Essential hypertension   Hypokalemia   Acute hypoxemic respiratory failure (HCC)   History of temporal arteritis   Hyperglycemia  Resolved Problems:   * No resolved hospital problems. [facility] Course: 82 y.o. female with medical history significant of hypertension, arthritis, GERD, history of temporal arteritis in July 2024 who presented to the ED complaining of shortness of breath and cough despite receiving an outpatient course of doxycycline.    Upon evaluation in the emergency department patient was found to be saturating between 86 and 88% on room air and was therefore placed on supplemental oxygen at 2 L  via nasal cannula. Chest x-ray showed low lung volumes with mild  bibasilar atelectasis (left greater than right) and small left pleural effusion with concerns for bibasilar pneumonia.  The hospitalist group was then called to assess the patient for admission to the hospital.  Patient was initiated on intravenous fluids as well as intravenous ceftriaxone and azithromycin.  Electrolyte abnormalities including hypokalemia were corrected.  Over the course of the hospitalization patient rapidly clinically improved.  Upon thorough review of the patient's CT imaging is significant amount of mucous plugging was noted and patient was advised to discuss obtaining a pulmonary function test or pulmonology referral with her primary care provider upon follow-up.  Patient was discharged home in improved and stable condition on Omnicef and azithromycin on 04/11/2023.     Consultants: None Procedures performed: None  Disposition: Home Diet recommendation:  Discharge Diet Orders (From admission, onward)     Start     Ordered   04/11/23 0000  Diet - low sodium heart healthy        04/11/23 1559           Cardiac diet  DISCHARGE MEDICATION: Allergies as of 04/11/2023       Reactions   Amlodipine Besylate Other (See Comments)   Lisinopril Other (See Comments)   Tetanus Toxoids    Strawberry Extract Rash, Other (See Comments)   burning when touched on skin        Medication List     STOP taking these medications    doxycycline 100 MG capsule Commonly known as: VIBRAMYCIN       TAKE these medications    albuterol 108 (90 Base) MCG/ACT inhaler Commonly known as: VENTOLIN HFA Inhale 2 puffs into the lungs every 4 (  four) hours as needed for wheezing or shortness of breath. Use with spacer attached What changed:  when to take this additional instructions   CENTRUM SILVER PO Take 1 tablet by mouth daily.   chlorthalidone 25 MG tablet Commonly known as: HYGROTON Take 25 mg by mouth daily.   cholecalciferol 1000 units tablet Commonly known as:  VITAMIN D Take 1,000 Units by mouth daily.   [employer] Attach to inhaler with frequency consistent with your inhaler instructions   OVER THE COUNTER MEDICATION Take 2 tablets by mouth daily. Hair skin and nails-daily   pantoprazole 40 MG tablet Commonly known as: PROTONIX Take 1 tablet (40 mg total) by mouth daily.   predniSONE 5 MG tablet Commonly known as: DELTASONE Take 3 tablets (15 mg total) by mouth daily with breakfast. What changed:  medication strength how much to take how to take this when to take this additional instructions   telmisartan 20 MG tablet Commonly known as: Micardis Take 1 tablet (20 mg total) by mouth daily.   VITA-C PO Take 1 tablet by mouth daily.   ZINC PO Take 1 tablet by mouth daily.       ASK your doctor about these medications    azithromycin 500 MG tablet Commonly known as: Zithromax Take 1 tablet (500 mg total) by mouth daily for 4 days. Ask about: Should I take this medication?   cefdinir 300 MG capsule Commonly known as: OMNICEF Take 1 capsule (300 mg total) by mouth 2 (two) times daily for 5 days. Ask about: Should I take this medication?        Follow-up Information     Karie Georges, MD. Schedule an appointment as soon as possible for a visit in 1 week(s).   Specialty: Family Medicine Contact information: [address] [place] [address] [phone]                 Discharge Exam: Filed Weights   04/09/23 2157 04/10/23 0434  Weight: 75.8 kg 77.6 kg    Constitutional: Awake alert and oriented x3, no associated distress.   Respiratory: Bibasilar rales without wheezing normal respiratory effort. No accessory muscle use.  Cardiovascular: Regular rate and rhythm, no murmurs / rubs / gallops. No extremity edema. 2+ pedal pulses. No carotid bruits.  Abdomen: Abdomen is soft and nontender.  No evidence of intra-abdominal masses.  Positive bowel sounds noted in all quadrants.    Musculoskeletal: No joint deformity upper and lower extremities. Good ROM, no contractures. Normal muscle tone.     Condition at discharge: fair  The results of significant diagnostics from this hospitalization (including imaging, microbiology, ancillary and laboratory) are listed below for reference.   Imaging Studies: CT Angio Chest Pulmonary Embolism (PE) W or WO Contrast  Result Date: 04/10/2023 CLINICAL DATA:  Shortness of breath EXAM: CT ANGIOGRAPHY CHEST WITH CONTRAST TECHNIQUE: Multidetector CT imaging of the chest was performed using the standard protocol during bolus administration of intravenous contrast. Multiplanar CT image reconstructions and MIPs were obtained to evaluate the vascular anatomy. RADIATION DOSE REDUCTION: This exam was performed according to the departmental dose-optimization program which includes automated exposure control, adjustment of the mA and/or kV according to patient size and/or use of iterative reconstruction technique. CONTRAST:  OMNIPAQUE IOHEXOL 350 MG/ML SOLN COMPARISON:  Chest x-ray 04/09/2023 FINDINGS: Cardiovascular: Satisfactory opacification of the pulmonary arteries to the segmental level. No evidence of pulmonary embolism. Nonaneurysmal aorta. Mild atherosclerosis. Coronary vascular calcification. Normal cardiac size. No  significant effusion Mediastinum/Nodes: Midline trachea. Right thyroidectomy. No suspicious lymph nodes. Small hiatal hernia. Lungs/Pleura: No pleural effusion or pneumothorax. Mild posterior bowing of distal trachea with narrowed a appearance of the main bronchi. Patchy mucous plugging within the bilateral lower lobe bronchi with bilateral lower lobe consolidations. Minimal right middle lobe consolidations. Upper Abdomen: No acute finding. Left renal cysts and parapelvic cysts for which no imaging follow-up is recommended Musculoskeletal: No acute or suspicious osseous abnormality. Review of the MIP images confirms the above  findings. IMPRESSION: 1. Negative for acute pulmonary embolus. 2. Patchy mucous plugging within the bilateral lower lobe bronchi with bilateral lower lobe and minimal right middle lobe consolidations, findings are suspicious for pneumonia. 3. 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 4. Small hiatal hernia. 5. Aortic atherosclerosis. Aortic Atherosclerosis (ICD10-I70.0). Electronically Signed   By: Jasmine Pang M.D.   On: 04/10/2023 16:04   DG Chest Port 1 View  Result Date: 04/10/2023 CLINICAL DATA:  Shortness of breath. EXAM: PORTABLE CHEST 1 VIEW COMPARISON:  December 04, 2022 FINDINGS: The heart size and mediastinal contours are within normal limits. There is mild calcification of the aortic arch. Low lung volumes are noted. Mild atelectasis is seen within the bilateral lung bases, left greater than right. A small left pleural effusion is suspected. No pneumothorax is identified. Multilevel degenerative changes seen throughout the thoracic spine. IMPRESSION: 1. Low lung volumes with mild bibasilar atelectasis, left greater than right. 2. Small left pleural effusion. Electronically Signed   By: Aram Candela M.D.   On: 04/10/2023 02:06    Microbiology: Results for orders placed or performed during the hospital encounter of 04/09/23  Resp panel by RT-PCR (RSV, Flu A&B, Covid)     Status: None   Collection Time: 04/09/23 10:08 PM   Specimen: Nasal Swab  Result Value Ref Range Status   SARS Coronavirus 2 by RT PCR NEGATIVE NEGATIVE Final    Comment: (NOTE) SARS-CoV-2 target nucleic acids are NOT DETECTED.  The SARS-CoV-2 RNA is generally detectable in upper respiratory specimens during the acute phase of infection. The lowest concentration of SARS-CoV-2 viral copies this assay can detect is 138 copies/mL. A negative result does not preclude SARS-Cov-2 infection and should not be used as the  sole basis for treatment or other patient management decisions. A negative result may occur with  improper specimen collection/handling, submission of specimen other than nasopharyngeal swab, presence of viral mutation(s) within the areas targeted by this assay, and inadequate number of viral copies(<138 copies/mL). A negative result must be combined with clinical observations, patient history, and epidemiological information. The expected result is Negative.  Fact Sheet for Patients:  BloggerCourse.com  Fact Sheet for Healthcare Providers:  SeriousBroker.it  This test is no t yet approved or cleared by the [place] FDA and  has been authorized for detection and/or diagnosis of SARS-CoV-2 by FDA under an Emergency Use Authorization (EUA). This EUA will remain  in effect (meaning this test can be used) for the duration of the COVID-19 declaration under Section 564(b)(1) of the Act, 21 U.S.C.section 360bbb-3(b)(1), unless the authorization is terminated  or revoked sooner.       Influenza A by PCR NEGATIVE NEGATIVE Final   Influenza B by PCR NEGATIVE NEGATIVE Final    Comment: (NOTE) The Xpert Xpress SARS-CoV-2/FLU/RSV plus assay is intended as an aid in the diagnosis of influenza from Nasopharyngeal swab specimens and should  not be used as a sole basis for treatment. Nasal washings and aspirates are unacceptable for Xpert Xpress SARS-CoV-2/FLU/RSV testing.  Fact Sheet for Patients: BloggerCourse.com  Fact Sheet for Healthcare Providers: SeriousBroker.it  This test is not yet approved or cleared by the [place] FDA and has been authorized for detection and/or diagnosis of SARS-CoV-2 by FDA under an Emergency Use Authorization (EUA). This EUA will remain in effect (meaning this test can be used) for the duration of the COVID-19 declaration under Section 564(b)(1) of the  Act, 21 U.S.C. section 360bbb-3(b)(1), unless the authorization is terminated or revoked.     Resp Syncytial Virus by PCR NEGATIVE NEGATIVE Final    Comment: (NOTE) Fact Sheet for Patients: BloggerCourse.com  Fact Sheet for Healthcare Providers: SeriousBroker.it  This test is not yet approved or cleared by the [place] FDA and has been authorized for detection and/or diagnosis of SARS-CoV-2 by FDA under an Emergency Use Authorization (EUA). This EUA will remain in effect (meaning this test can be used) for the duration of the COVID-19 declaration under Section 564(b)(1) of the Act, 21 U.S.C. section 360bbb-3(b)(1), unless the authorization is terminated or revoked.  Performed at [facility], [address], [place], [place] [address]   Blood Culture (routine x 2)     Status: None   Collection Time: 04/09/23 10:08 PM   Specimen: BLOOD  Result Value Ref Range Status   Specimen Description   Final    BLOOD LEFT ANTECUBITAL Performed at [facility], [address] [place], [place] [address]    Special Requests   Final    BOTTLES DRAWN AEROBIC AND ANAEROBIC Blood Culture results may not be optimal due to an excessive volume of blood received in culture bottles Performed at [facility], [address], [place], [place] [address]    Culture   Final    NO GROWTH 5 DAYS Performed at [facility], [address] [place], [place] [address]    Report Status 04/15/2023 FINAL  Final  Blood Culture (routine x 2)     Status: None   Collection Time: 04/09/23 10:55 PM   Specimen: BLOOD LEFT FOREARM  Result Value Ref Range Status   Specimen Description   Final    BLOOD LEFT FOREARM Performed at [facility], [address], [place], [place] [address]    Special Requests   Final    BOTTLES DRAWN AEROBIC AND ANAEROBIC Blood Culture results may not be optimal  due to an excessive volume of blood received in culture bottles Performed at [facility], [address] [place], [place] [address]    Culture   Final    NO GROWTH 5 DAYS Performed at [facility], [address], [place], [place] [address]    Report Status 04/15/2023 FINAL  Final  Expectorated Sputum Assessment w Gram Stain, Rflx to Resp Cult     Status: None   Collection Time: 04/10/23  7:49 AM   Specimen: Sputum  Result Value Ref Range Status   Specimen Description SPUTUM  Final   Special Requests NONE  Final   Sputum evaluation   Final    Sputum specimen not acceptable for testing.  Please recollect.   NOTIFIED KHONJE,S RN ON 04/10/23 AT 1356 BY GOLSONM Performed at [facility], [address] [place], [place] [address]    Report Status 04/10/2023 FINAL  Final  Respiratory (~20 pathogens) panel by PCR  Status: None   Collection Time: 04/10/23 10:16 AM  Result Value Ref Range Status   Adenovirus NOT DETECTED NOT DETECTED Final   Coronavirus 229E NOT DETECTED NOT DETECTED Final    Comment: (NOTE) The Coronavirus on the Respiratory Panel, DOES NOT test for the novel  Coronavirus (2019 nCoV)    Coronavirus HKU1 NOT DETECTED NOT DETECTED Final   Coronavirus NL63 NOT DETECTED NOT DETECTED Final   Coronavirus OC43 NOT DETECTED NOT DETECTED Final   Metapneumovirus NOT DETECTED NOT DETECTED Final   Rhinovirus / Enterovirus NOT DETECTED NOT DETECTED Final   Influenza A NOT DETECTED NOT DETECTED Final   Influenza B NOT DETECTED NOT DETECTED Final   Parainfluenza Virus 1 NOT DETECTED NOT DETECTED Final   Parainfluenza Virus 2 NOT DETECTED NOT DETECTED Final   Parainfluenza Virus 3 NOT DETECTED NOT DETECTED Final   Parainfluenza Virus 4 NOT DETECTED NOT DETECTED Final   Respiratory Syncytial Virus NOT DETECTED NOT DETECTED Final   Bordetella pertussis NOT DETECTED NOT DETECTED Final   Bordetella Parapertussis NOT DETECTED NOT  DETECTED Final   Chlamydophila pneumoniae NOT DETECTED NOT DETECTED Final   Mycoplasma pneumoniae NOT DETECTED NOT DETECTED Final    Comment: Performed at [facility], [address], [place], [place] [address]    Labs: CBC: Recent Labs  Lab 04/18/23 1033  WBC 10.0  NEUTROABS 7.8*  HGB 13.2  HCT 38.0  MCV 96.8  PLT 212.0   Basic Metabolic Panel: Recent Labs  Lab 04/18/23 1033  NA 140  K 3.0*  CL 105  CO2 28  GLUCOSE 146*  BUN 27*  CREATININE 0.89  CALCIUM 9.1   Liver Function Tests: No results for input(s): "AST", "ALT", "ALKPHOS", "BILITOT", "PROT", "ALBUMIN" in the last 168 hours. CBG: No results for input(s): "GLUCAP" in the last 168 hours.  Discharge time spent: greater than 30 minutes.  Signed: Marinda Elk, MD [facility] 04/19/2023

## 2023-04-19 NOTE — Addendum Note (Signed)
Addended by: Christy Sartorius on: 04/19/2023 09:27 AM   Modules accepted: Orders

## 2023-04-19 NOTE — Addendum Note (Signed)
Addended by: Christy Sartorius on: 04/19/2023 09:28 AM   Modules accepted: Orders

## 2023-04-24 ENCOUNTER — Encounter: Payer: PPO | Admitting: Internal Medicine

## 2023-05-03 ENCOUNTER — Other Ambulatory Visit: Payer: PPO

## 2023-05-03 DIAGNOSIS — E876 Hypokalemia: Secondary | ICD-10-CM

## 2023-05-03 LAB — BASIC METABOLIC PANEL
BUN: 32 mg/dL — ABNORMAL HIGH (ref 6–23)
CO2: 28 meq/L (ref 19–32)
Calcium: 9.3 mg/dL (ref 8.4–10.5)
Chloride: 103 meq/L (ref 96–112)
Creatinine, Ser: 0.94 mg/dL (ref 0.40–1.20)
GFR: 55.9 mL/min — ABNORMAL LOW (ref >60.00)
Glucose, Bld: 118 mg/dL — ABNORMAL HIGH (ref 70–99)
Potassium: 3.6 meq/L (ref 3.5–5.1)
Sodium: 140 meq/L (ref 135–145)

## 2023-05-22 ENCOUNTER — Ambulatory Visit (INDEPENDENT_AMBULATORY_CARE_PROVIDER_SITE_OTHER): Payer: PPO | Admitting: Family Medicine

## 2023-05-22 ENCOUNTER — Encounter: Payer: Self-pay | Admitting: Family Medicine

## 2023-05-22 VITALS — BP 130/60 | HR 76 | Temp 97.8°F | Ht 61.0 in | Wt 176.3 lb

## 2023-05-22 DIAGNOSIS — E876 Hypokalemia: Secondary | ICD-10-CM

## 2023-05-22 DIAGNOSIS — I10 Essential (primary) hypertension: Secondary | ICD-10-CM | POA: Diagnosis not present

## 2023-05-22 MED ORDER — POTASSIUM CHLORIDE CRYS ER 10 MEQ PO TBCR: EXTENDED_RELEASE_TABLET | ORAL | 5 refills | Status: DC

## 2023-05-22 NOTE — Patient Instructions (Signed)
Add a humidifier at night to help with the nosebleeds.

## 2023-05-22 NOTE — Progress Notes (Signed)
Established Patient Office Visit  Subjective   Patient ID: Sara Boyer, female    DOB: 06/21/1940  Age: 82 y.o. MRN: [id number]  Chief Complaint  Patient presents with   Medical Management of Chronic Issues    Pt is here for 6 month follow up today.  HTN -- BP in office performed and is well controlled. She  reports no side effects to the medications, no chest pain, SOB, dizziness or headaches. She has a BP cuff at home and is checking BP regularly, reports they are in the normal range. I reviewed her last BMP which showed hypokalemia, likely secondary to the chlorthalidone, pt denies any muscle cramps.   Patient was diagnosed with temporal arteritis recently, is on prednisone therapy as well as humira, states that she is having some nosebleeds and feels like she is having some side effects from the prednisone.       Current Outpatient Medications  Medication Instructions   albuterol (VENTOLIN HFA) 108 (90 Base) MCG/ACT inhaler 2 puffs, Inhalation, Every 4 hours PRN, Use with spacer attached   Ascorbic Acid (VITA-C PO) 1 tablet, Daily   chlorthalidone (HYGROTON) 25 mg, Daily   cholecalciferol (VITAMIN D) 1,000 Units, Daily   Multiple Vitamins-Minerals (CENTRUM SILVER PO) 1 tablet, Daily   Multiple Vitamins-Minerals (ZINC PO) 1 tablet, Daily   OVER THE COUNTER MEDICATION 2 tablets, Daily   pantoprazole (PROTONIX) 40 mg, Oral, Daily   potassium chloride (KLOR-CON M) 10 MEQ tablet Take 2 tablets twice daily   predniSONE (DELTASONE) 15 mg, Oral, Daily with breakfast   Spacer/Aero-Holding Chambers (COMPACT SPACE CHAMBER) DEVI Attach to inhaler with frequency consistent with your inhaler instructions   telmisartan (MICARDIS) 20 mg, Oral, Daily       Review of Systems  All other systems reviewed and are negative.     Objective:     BP 130/60   Pulse 76   Temp 97.8 F (36.6 C) (Oral)   Ht 5\' 1"  (1.549 m)   Wt 176 lb 4.8 oz (80 kg)   SpO2 98%   BMI 33.31 kg/m      Physical Exam Vitals reviewed.  Constitutional:      Appearance: Normal appearance. She is well-groomed. She is obese.  Cardiovascular:     Rate and Rhythm: Normal rate and regular rhythm.     Heart sounds: S1 normal and S2 normal.  Pulmonary:     Effort: Pulmonary effort is normal.     Breath sounds: Normal breath sounds and air entry.  Musculoskeletal:     Right lower leg: No edema.     Left lower leg: No edema.  Neurological:     Mental Status: She is alert and oriented to person, place, and time. Mental status is at baseline.     Gait: Gait is intact.  Psychiatric:        Mood and Affect: Mood and affect normal.        Speech: Speech normal.        Behavior: Behavior normal.      No results found for any visits on 05/22/23.     The ASCVD Risk score (Arnett DK, et al., 2019) failed to calculate for the following reasons:   The 2019 ASCVD risk score is only valid for ages 2 to 40    Assessment & Plan:  Hypokalemia -     Potassium Chloride Crys ER; Take 2 tablets twice daily  Dispense: 120 tablet; Refill: 5  Essential hypertension Assessment &  Plan: Current hypertension medications:       Sig   chlorthalidone (HYGROTON) 25 MG tablet (Taking) Take 25 mg by mouth daily.   telmisartan (MICARDIS) 20 MG tablet (Taking) Take 1 tablet (20 mg total) by mouth daily.      Chronic, stable, BP is well controlled on the above medications, will recheck BMP to look at the potassium level. I have refilled her potassium supplements for her to continue as well.       Return in about 6 months (around 11/20/2023) for HTN.    Karie Georges, MD

## 2023-05-28 NOTE — Assessment & Plan Note (Signed)
Current hypertension medications:       Sig   chlorthalidone (HYGROTON) 25 MG tablet (Taking) Take 25 mg by mouth daily.   telmisartan (MICARDIS) 20 MG tablet (Taking) Take 1 tablet (20 mg total) by mouth daily.      Chronic, stable, BP is well controlled on the above medications, will recheck BMP to look at the potassium level. I have refilled her potassium supplements for her to continue as well.

## 2023-06-27 ENCOUNTER — Other Ambulatory Visit: Payer: Self-pay | Admitting: Family Medicine

## 2023-07-05 ENCOUNTER — Ambulatory Visit: Payer: PPO | Admitting: Family Medicine

## 2023-07-24 DIAGNOSIS — Z13220 Encounter for screening for lipoid disorders: Secondary | ICD-10-CM | POA: Diagnosis not present

## 2023-07-24 DIAGNOSIS — M316 Other giant cell arteritis: Secondary | ICD-10-CM | POA: Diagnosis not present

## 2023-07-24 DIAGNOSIS — Z7952 Long term (current) use of systemic steroids: Secondary | ICD-10-CM | POA: Diagnosis not present

## 2023-07-24 DIAGNOSIS — R252 Cramp and spasm: Secondary | ICD-10-CM | POA: Diagnosis not present

## 2023-07-24 DIAGNOSIS — M1991 Primary osteoarthritis, unspecified site: Secondary | ICD-10-CM | POA: Diagnosis not present

## 2023-07-24 DIAGNOSIS — E669 Obesity, unspecified: Secondary | ICD-10-CM | POA: Diagnosis not present

## 2023-07-24 DIAGNOSIS — K219 Gastro-esophageal reflux disease without esophagitis: Secondary | ICD-10-CM | POA: Diagnosis not present

## 2023-07-24 DIAGNOSIS — Z6833 Body mass index (BMI) 33.0-33.9, adult: Secondary | ICD-10-CM | POA: Diagnosis not present

## 2023-07-25 LAB — LAB REPORT - SCANNED: EGFR: 52

## 2023-07-26 ENCOUNTER — Encounter: Payer: PPO | Admitting: Family Medicine

## 2023-07-26 ENCOUNTER — Other Ambulatory Visit: Payer: Self-pay | Admitting: Family Medicine

## 2023-07-26 MED ORDER — CHLORTHALIDONE 25 MG PO TABS: 25.0000 mg | ORAL_TABLET | Freq: Every day | ORAL | 1 refills | Status: DC

## 2023-07-26 MED ORDER — TELMISARTAN 20 MG PO TABS: 20.0000 mg | ORAL_TABLET | Freq: Every day | ORAL | 1 refills | Status: DC

## 2023-07-26 NOTE — Telephone Encounter (Signed)
 Copied from CRM [id number]. Topic: Clinical - Prescription Issue >> Jul 26, 2023  1:11 PM Alvino Blood C wrote: Reason for CRM:  Patient is visiting [facility], [place] patient left medication the below at home and they are asking to have a temp supply to the pharmacy in [facility]. The pharmacy is willing to provide a 3 day supply to the patient telmisartan (MICARDIS) 20 MG tablet  chlorthalidone (HYGROTON) 25 MG tablet

## 2023-08-21 ENCOUNTER — Other Ambulatory Visit: Payer: Self-pay | Admitting: Family Medicine

## 2023-08-21 DIAGNOSIS — Z1231 Encounter for screening mammogram for malignant neoplasm of breast: Secondary | ICD-10-CM

## 2023-08-24 DIAGNOSIS — D3131 Benign neoplasm of right choroid: Secondary | ICD-10-CM | POA: Diagnosis not present

## 2023-08-24 DIAGNOSIS — M316 Other giant cell arteritis: Secondary | ICD-10-CM | POA: Diagnosis not present

## 2023-08-24 DIAGNOSIS — H43813 Vitreous degeneration, bilateral: Secondary | ICD-10-CM | POA: Diagnosis not present

## 2023-09-06 ENCOUNTER — Ambulatory Visit: Payer: Self-pay | Admitting: Family Medicine

## 2023-09-06 DIAGNOSIS — Z0000 Encounter for general adult medical examination without abnormal findings: Secondary | ICD-10-CM | POA: Diagnosis not present

## 2023-09-06 NOTE — Patient Instructions (Signed)
 I really enjoyed getting to talk with you today! I am available on Tuesdays and Thursdays for virtual visits if you have any questions or concerns, or if I can be of any further assistance.   CHECKLIST FROM ANNUAL WELLNESS VISIT:  -Follow up (please call to schedule if not scheduled after visit):   -yearly for annual wellness visit with primary care office  Here is a list of your preventive care/health maintenance measures and the plan for each if any are due:  PLAN For any measures below that may be due:  -call your prior PCP for the dates and names of the pneumonia vaccines you had -let us know when you want to do the bone density test  Health Maintenance  Topic Date Due   Pneumonia Vaccine 92+ Years old (1 of 1 - PCV) Never done   COVID-19 Vaccine (3 - 2024-25 season) 01/28/2023   INFLUENZA VACCINE  12/28/2023   Medicare Annual Wellness (AWV)  09/05/2024   DEXA SCAN  Completed   HPV VACCINES  Aged Out   Meningococcal B Vaccine  Aged Out   DTaP/Tdap/Td  Discontinued   Zoster Vaccines- Shingrix  Discontinued    -See a dentist at least yearly  -Get your eyes checked and then per your eye specialist's recommendations  -Other issues addressed today:   -I have included below further information regarding a healthy whole foods based diet, physical activity guidelines for adults, stress management and opportunities for social connections. I hope you find this information useful.   -----------------------------------------------------------------------------------------------------------------------------------------------------------------------------------------------------------------------------------------------------------    NUTRITION: -eat real food: lots of colorful vegetables (half the plate) and fruits -5-7 servings of vegetables and fruits per day (fresh or steamed is best), exp. 2 servings of vegetables with lunch and dinner and 2 servings of fruit per day. Berries  and greens such as kale and collards are great choices.  -consume on a regular basis:  fresh fruits, fresh veggies, fish, nuts, seeds, healthy oils (such as olive oil, avocado oil), whole grains (make sure for bread/pasta/crackers/etc., that the first ingredient on label contains the word "whole"), legumes. -can eat small amounts of dairy (however in your case you may wish to avoid to see if it helps the chronic bowel issues) and lean meat (no larger than the palm of your hand), but avoid processed meats such as ham, bacon, lunch meat, etc. -drink water -try to avoid fast food and pre-packaged foods, processed meat, ultra processed foods/beverages (donuts, candy, etc.) -most experts advise limiting sodium to < 2300mg  per day, should limit further is any chronic conditions such as high blood pressure, heart disease, diabetes, etc. The American Heart Association advised that < 1500mg  is is ideal -try to avoid foods/beverages that contain any ingredients with names you do not recognize  -try to avoid foods/beverages  with added sugar or sweeteners/sweets  -try to avoid sweet drinks (including diet drinks): soda, juice, Gatorade, sweet tea, power drinks, diet drinks -try to avoid white rice, white bread, pasta (unless whole grain)  EXERCISE GUIDELINES FOR ADULTS: -if you wish to increase your physical activity, do so gradually and with the approval of your doctor -STOP and seek medical care immediately if you have any chest pain, chest discomfort or trouble breathing when starting or increasing exercise  -move and stretch your body, legs, feet and arms when sitting for long periods -Physical activity guidelines for optimal health in adults: -get at least 150 minutes per week of moderate exercise (can talk, but not sing); this is about  20-30 minutes of sustained activity 5-7 days per week or two 10-15 minute episodes of sustained activity 5-7 days per week -do some muscle building/resistance  training/strength training at least 2 days per week  -balance exercises 3+ days per week:   Stand somewhere where you have something sturdy to hold onto if you lose balance    1) lift up on toes, then back down, start with 5x per day and work up to 20x   2) stand and lift one leg straight out to the side so that foot is a few inches of the floor, start with 5x each side and work up to 20x each side   3) stand on one foot, start with 5 seconds each side and work up to 20 seconds on each side  If you need ideas or help with getting more active:  -Silver sneakers https://tools.silversneakers.com  -Walk with a Doc: http://www.duncan-[email or web address]/  -try to include resistance (weight lifting/strength building) and balance exercises twice per week: or the following link for ideas: http://castillo-powell.com/  BuyDucts.dk  STRESS MANAGEMENT: -can try meditating, or just sitting quietly with deep breathing while intentionally relaxing all parts of your body for 5 minutes daily -if you need further help with stress, anxiety or depression please follow up with your primary doctor or contact the wonderful folks at [facility]: [phone]  SOCIAL CONNECTIONS: -options in [place] if you wish to engage in more social and exercise related activities:  -Silver sneakers https://tools.silversneakers.com  -Walk with a Doc: http://www.duncan-[email or web address]/  -Check out the [facility] Active Adults 50+ section on the [place] [employer] (hiking clubs, book clubs, cards and games, chess, exercise classes, aquatic classes and much more) - see the website for details: https://www.[place].gov/departments/parks-recreation/active-adults50  -YouTube has lots of exercise videos for different ages and abilities as well  -[facility] (a variety of indoor and outdoor inperson activities for  adults). [phone]. [address]  -Virtual Online Classes (a variety of topics): see seniorplanet.org or call [phone]  -consider volunteering at a school, hospice center, church, senior center or elsewhere

## 2023-09-06 NOTE — Progress Notes (Signed)
 PATIENT CHECK-IN and HEALTH RISK ASSESSMENT QUESTIONNAIRE:  -completed by phone/video for upcoming Medicare Preventive Visit  Pre-Visit Check-in: 1)Vitals (height, wt, BP, etc) - record in vitals section for visit on day of visit Request home vitals (wt, BP, etc.) and enter into vitals, THEN update Vital Signs SmartPhrase below at the top of the HPI. See below.  2)Review and Update Medications, Allergies PMH, Surgeries, Social history in Epic 3)Hospitalizations in the last year with date/reason?   Pneumonia in November 4)Review and Update Care Team (patient's specialists) in Epic 5) Complete PHQ9 in Epic  6) Complete Fall Screening in Epic 7)Review all Health Maintenance Due and order under PCP if not done.  Medicare Wellness Patient Questionnaire:  Answer theses question about your habits: How often do you have a drink containing alcohol? 1 glass of wine once or twice a month How often do you have six or more drinks on one occasion?n Have you ever smoked? n On average, how many days per week do you engage in moderate to strenuous exercise (like a brisk walk)? Works out in the yard when it is nice, stays active doing her own housework and cooking - has a big yard Typical diet: she does her own cooking, eats Mediterranean diet  Beverages:  water  Answer theses question about your everyday activities: Can you perform most household chores?y Are you deaf or have significant trouble hearing?n Do you feel that you have a problem with memory?n Do you feel safe at home?y Last dentist visit?just went a few weeks ago 8. Do you have any difficulty performing your everyday activities?n Are you having any difficulty walking, taking medications on your own, and or difficulty managing daily home needs?n Do you have difficulty walking or climbing stairs?n Do you have difficulty dressing or bathing?n Do you have difficulty doing errands alone such as visiting a doctor's office or shopping?n Do you  currently have any difficulty preparing food and eating?n Do you currently have any difficulty using the toilet?n Do you have any difficulty managing your finances?n Do you have any difficulties with housekeeping of managing your housekeeping?n   Do you have Advanced Directives in place (Living Will, Healthcare Power or Attorney)?  yes   Last eye Exam and location? 1-2 weeks ago, sees retinal specialist, Dr. Essie Hart, [place] [facility]   Do you currently use prescribed or non-prescribed narcotic or opioid pain medications?n  Do you have a history or close family history of breast, ovarian, tubal or peritoneal cancer or a family member with BRCA (breast cancer susceptibility 1 and 2) gene mutations? adopted      ----------------------------------------------------------------------------------------------------------------------------------------------------------------------------------------------------------------------  Because this visit was a virtual/telehealth visit, some criteria may be missing or patient reported. Any vitals not documented were not able to be obtained and vitals that have been documented are patient reported.    MEDICARE ANNUAL PREVENTIVE VISIT WITH PROVIDER: (Welcome to Medicare, initial annual wellness or annual wellness exam)  Virtual Visit via Video Note  I connected with Sara Boyer on 09/06/23 by a video enabled telemedicine application and verified that I am speaking with the correct person using two identifiers.  Location patient: home Location provider:work or home office Persons participating in the virtual visit: patient, provider  Concerns and/or follow up today: no new concerns    has a past medical history of Anemia, Arthritis, Hypertension, Pneumonia, and Shortness of breath.   See HM section in Epic for other details of completed HM.    ROS: negative for report of fevers,  unintentional weight loss, vision changes, vision loss,  hearing loss or change, chest pain, sob, hemoptysis, melena, hematochezia, hematuria, falls, bleeding or bruising, thoughts of suicide or self harm, memory loss  Patient-completed extensive health risk assessment - reviewed and discussed with the patient: See Health Risk Assessment completed with patient prior to the visit either above or in recent phone note. This was reviewed in detailed with the patient today and appropriate recommendations, orders and referrals were placed as needed per Summary below and patient instructions.   Review of Medical History: -PMH, PSH, Family History and current specialty and care providers reviewed and updated and listed below   Patient Care Team: Karie Georges, MD as PCP - General (Family Medicine) Leonie Douglas, MD as Consulting Physician (Vascular Surgery) Shelah Lewandowsky, MD as Consulting Physician (Optometry)   Past Medical History:  Diagnosis Date   Anemia    hx of as a child    Arthritis    Hypertension    Pneumonia    hx of    Shortness of breath    with exertion    Past Surgical History:  Procedure Laterality Date   ABDOMINAL HYSTERECTOMY     APPENDECTOMY     ARTERY BIOPSY Left 12/11/2022   Procedure: LEFT BIOPSY TEMPORAL ARTERY;  Surgeon: Victorino Sparrow, MD;  Location: [facility] OR;  Service: Vascular;  Laterality: Left;   BREAST CYST EXCISION Right    BREAST SURGERY     right breast surgery for benign lump    corrective surgery to open fallopian tubes      DILATION AND CURETTAGE OF UTERUS     several    HEMORRHOID SURGERY     lobe removed from thyroid      SHOULDER OPEN ROTATOR CUFF REPAIR  01/24/2012   Procedure: ROTATOR CUFF REPAIR SHOULDER OPEN;  Surgeon: Jacki Cones, MD;  Location: [facility] ORS;  Service: Orthopedics;  Laterality: Left;  Left Shoulder Rotator Cuff Repair with Graft and Anchors and acrominectomy   TONSILLECTOMY      Social History   Socioeconomic History   Marital status: Married    Spouse name: Not on  file   Number of children: Not on file   Years of education: Not on file   Highest education level: Not on file  Occupational History   Not on file  Tobacco Use   Smoking status: Never   Smokeless tobacco: Never  Substance and Sexual Activity   Alcohol use: Yes    Comment: occasional glass of wine or cocktail    Drug use: No   Sexual activity: Not on file  Other Topics Concern   Not on file  Social History Narrative   Not on file   Social Drivers of Health   Financial Resource Strain: Not on file  Food Insecurity: No Food Insecurity (04/10/2023)   Hunger Vital Sign    Worried About Running Out of Food in the Last Year: Never true    Ran Out of Food in the Last Year: Never true  Transportation Needs: No Transportation Needs (04/10/2023)   PRAPARE - Administrator, Civil Service (Medical): No    Lack of Transportation (Non-Medical): No  Physical Activity: Not on file  Stress: Not on file  Social Connections: Not on file  Intimate Partner Violence: Not At Risk (04/10/2023)   Humiliation, Afraid, Rape, and Kick questionnaire    Fear of Current or Ex-Partner: No    Emotionally Abused: No  Physically Abused: No    Sexually Abused: No    Family History  Problem Relation Age of Onset   Breast cancer Cousin 5       again @ 42    Current Outpatient Medications on File Prior to Visit  Medication Sig Dispense Refill   albuterol (VENTOLIN HFA) 108 (90 Base) MCG/ACT inhaler Inhale 2 puffs into the lungs every 4 (four) hours as needed for wheezing or shortness of breath. Use with spacer attached 8 g 1   Ascorbic Acid (VITA-C PO) Take 1 tablet by mouth daily.     chlorthalidone (HYGROTON) 25 MG tablet Take 1 tablet (25 mg total) by mouth daily. with food 90 tablet 1   cholecalciferol (VITAMIN D) 1000 UNITS tablet Take 1,000 Units by mouth daily.     Multiple Vitamins-Minerals (CENTRUM SILVER PO) Take 1 tablet by mouth daily.     Multiple Vitamins-Minerals (ZINC PO)  Take 1 tablet by mouth daily.     OVER THE COUNTER MEDICATION Take 2 tablets by mouth daily. Hair skin and nails-daily     pantoprazole (PROTONIX) 40 MG tablet Take 1 tablet (40 mg total) by mouth daily. 30 tablet 0   potassium chloride (KLOR-CON M) 10 MEQ tablet Take 2 tablets twice daily 120 tablet 5   predniSONE (DELTASONE) 5 MG tablet Take 3 tablets (15 mg total) by mouth daily with breakfast.     Spacer/Aero-Holding Chambers (COMPACT SPACE CHAMBER) DEVI Attach to inhaler with frequency consistent with your inhaler instructions 1 each 0   telmisartan (MICARDIS) 20 MG tablet Take 1 tablet (20 mg total) by mouth daily. 90 tablet 1   No current facility-administered medications on file prior to visit.    Allergies  Allergen Reactions   Amlodipine Besylate Other (See Comments)   Lisinopril Other (See Comments)   Synthroid [Levothyroxine]     Heart palpitations   Tetanus Toxoids    Strawberry Extract Rash and Other (See Comments)    burning when touched on skin       Physical Exam Vitals requested from patient and listed below if patient had equipment and was able to obtain at home for this virtual visit: There were no vitals filed for this visit. Estimated body mass index is 33.31 kg/m as calculated from the following:   Height as of 05/22/23: 5\' 1"  (1.549 m).   Weight as of 05/22/23: 176 lb 4.8 oz (80 kg).  EKG (optional): deferred due to virtual visit  GENERAL: alert, oriented, no acute distress detected, full vision exam deferred due to pandemic and/or virtual encounter  HEENT: atraumatic, conjunttiva clear, no obvious abnormalities on inspection of external nose and ears  NECK: normal movements of the head and neck  LUNGS: on inspection no signs of respiratory distress, breathing rate appears normal, no obvious gross SOB, gasping or wheezing  CV: no obvious cyanosis  MS: moves all visible extremities without noticeable abnormality  PSYCH/NEURO: pleasant and  cooperative, no obvious depression or anxiety, speech and thought processing grossly intact, Cognitive function grossly intact  Flowsheet Row Office Visit from 01/02/2023 in [facility] [place]  PHQ-9 Total Score 2           09/06/2023   10:23 AM 01/02/2023    9:02 AM  Depression screen PHQ 2/9  Decreased Interest 0 0  Down, Depressed, Hopeless 0 0  PHQ - 2 Score 0 0  Altered sleeping  0  Tired, decreased energy  2  Change in appetite  0  Feeling bad or failure about yourself   0  Trouble concentrating  0  Moving slowly or fidgety/restless  0  Suicidal thoughts  0  PHQ-9 Score  2  Difficult doing work/chores  Not difficult at all       01/02/2023    9:01 AM 09/06/2023   10:20 AM  Fall Risk  Falls in the past year? 1 1  Was there an injury with Fall? 0 0  Fall Risk Category Calculator 2 2  Patient at Risk for Falls Due to History of fall(s)   Fall risk Follow up Falls evaluation completed Falls evaluation completed;Education provided     SUMMARY AND PLAN:  Encounter for Medicare annual wellness exam  Discussed applicable health maintenance/preventive health measures and advised and referred or ordered per patient preferences: -discussed bone density, she declined for now but agrees to call to schedule  -discussed vaccines due/risks, she plans to check with prior PCP to see if she had all of her pneumonia shots -she had side effects with the covid vaccines and has opted not to do Health Maintenance  Topic Date Due   Pneumonia Vaccine 65+ Years old (1 of 1 - PCV) Never done   COVID-19 Vaccine (3 - 2024-25 season) 01/28/2023   INFLUENZA VACCINE  12/28/2023   Medicare Annual Wellness (AWV)  09/05/2024   DEXA SCAN  Completed   HPV VACCINES  Aged Out   Meningococcal B Vaccine  Aged Out   DTaP/Tdap/Td  Discontinued   Zoster Vaccines- Shingrix  Discontinued   Education and counseling on the following was provided based on the above review of health  and a plan/checklist for the patient, along with additional information discussed, was provided for the patient in the patient instructions :  -Provided counseling and plan for increased risk of falling if applicable per above screening. Reviewed and demonstrated safe balance exercises that can be done at home to improve balance and discussed exercise guidelines for adults with include balance exercises at least 3 days per week.  -Advised and counseled on a healthy lifestyle - including the importance of a healthy diet, regular physical activity,  -Reviewed patient's current diet.  A summary of a healthy diet was provided in the Patient Instructions.  -reviewed patient's current physical activity level and discussed exercise guidelines for adults. Discussed community resources and ideas for safe exercise at home to assist in meeting exercise guideline recommendations in a safe and healthy way.  -Advise yearly dental visits at minimum and regular eye exams -Advised and counseled on alcohol safe limits, risks Follow up: see patient instructions     Patient Instructions  I really enjoyed getting to talk with you today! I am available on Tuesdays and Thursdays for virtual visits if you have any questions or concerns, or if I can be of any further assistance.   CHECKLIST FROM ANNUAL WELLNESS VISIT:  -Follow up (please call to schedule if not scheduled after visit):   -yearly for annual wellness visit with primary care office  Here is a list of your preventive care/health maintenance measures and the plan for each if any are due:  PLAN For any measures below that may be due:  -call your prior PCP for the dates and names of the pneumonia vaccines you had -let us know when you want to do the bone density test  Health Maintenance  Topic Date Due   Pneumonia Vaccine 70+ Years old (1 of 1 - PCV) Never done   COVID-19  Vaccine (3 - 2024-25 season) 01/28/2023   INFLUENZA VACCINE  12/28/2023    Medicare Annual Wellness (AWV)  09/05/2024   DEXA SCAN  Completed   HPV VACCINES  Aged Out   Meningococcal B Vaccine  Aged Out   DTaP/Tdap/Td  Discontinued   Zoster Vaccines- Shingrix  Discontinued    -See a dentist at least yearly  -Get your eyes checked and then per your eye specialist's recommendations  -Other issues addressed today:   -I have included below further information regarding a healthy whole foods based diet, physical activity guidelines for adults, stress management and opportunities for social connections. I hope you find this information useful.   -----------------------------------------------------------------------------------------------------------------------------------------------------------------------------------------------------------------------------------------------------------    NUTRITION: -eat real food: lots of colorful vegetables (half the plate) and fruits -5-7 servings of vegetables and fruits per day (fresh or steamed is best), exp. 2 servings of vegetables with lunch and dinner and 2 servings of fruit per day. Berries and greens such as kale and collards are great choices.  -consume on a regular basis:  fresh fruits, fresh veggies, fish, nuts, seeds, healthy oils (such as olive oil, avocado oil), whole grains (make sure for bread/pasta/crackers/etc., that the first ingredient on label contains the word "whole"), legumes. -can eat small amounts of dairy (however in your case you may wish to avoid to see if it helps the chronic bowel issues) and lean meat (no larger than the palm of your hand), but avoid processed meats such as ham, bacon, lunch meat, etc. -drink water -try to avoid fast food and pre-packaged foods, processed meat, ultra processed foods/beverages (donuts, candy, etc.) -most experts advise limiting sodium to < 2300mg  per day, should limit further is any chronic conditions such as high blood pressure, heart disease, diabetes, etc.  The American Heart Association advised that < 1500mg  is is ideal -try to avoid foods/beverages that contain any ingredients with names you do not recognize  -try to avoid foods/beverages  with added sugar or sweeteners/sweets  -try to avoid sweet drinks (including diet drinks): soda, juice, Gatorade, sweet tea, power drinks, diet drinks -try to avoid white rice, white bread, pasta (unless whole grain)  EXERCISE GUIDELINES FOR ADULTS: -if you wish to increase your physical activity, do so gradually and with the approval of your doctor -STOP and seek medical care immediately if you have any chest pain, chest discomfort or trouble breathing when starting or increasing exercise  -move and stretch your body, legs, feet and arms when sitting for long periods -Physical activity guidelines for optimal health in adults: -get at least 150 minutes per week of moderate exercise (can talk, but not sing); this is about 20-30 minutes of sustained activity 5-7 days per week or two 10-15 minute episodes of sustained activity 5-7 days per week -do some muscle building/resistance training/strength training at least 2 days per week  -balance exercises 3+ days per week:   Stand somewhere where you have something sturdy to hold onto if you lose balance    1) lift up on toes, then back down, start with 5x per day and work up to 20x   2) stand and lift one leg straight out to the side so that foot is a few inches of the floor, start with 5x each side and work up to 20x each side   3) stand on one foot, start with 5 seconds each side and work up to 20 seconds on each side  If you need ideas or help with getting more active:  -  Silver sneakers https://tools.silversneakers.com  -Walk with a Doc: http://www.duncan-williams.com/  -try to include resistance (weight lifting/strength building) and balance exercises twice per week: or the following link for  ideas: http://castillo-powell.com/  BuyDucts.dk  STRESS MANAGEMENT: -can try meditating, or just sitting quietly with deep breathing while intentionally relaxing all parts of your body for 5 minutes daily -if you need further help with stress, anxiety or depression please follow up with your primary doctor or contact the wonderful folks at [facility]: [phone]  SOCIAL CONNECTIONS: -options in [place] if you wish to engage in more social and exercise related activities:  -Silver sneakers https://tools.silversneakers.com  -Walk with a Doc: http://www.duncan-williams.com/  -Check out the [facility] Active Adults 50+ section on the [place] of [employer] (hiking clubs, book clubs, cards and games, chess, exercise classes, aquatic classes and much more) - see the website for details: https://www.[place].gov/departments/parks-recreation/active-adults50  -YouTube has lots of exercise videos for different ages and abilities as well  -[facility] (a variety of indoor and outdoor inperson activities for adults). 7014634103. [address].  -Virtual Online Classes (a variety of topics): see seniorplanet.org or call [phone]  -consider volunteering at a school, hospice center, church, senior center or elsewhere            Terressa Koyanagi, DO

## 2023-09-24 ENCOUNTER — Other Ambulatory Visit: Payer: Self-pay | Admitting: Family Medicine

## 2023-09-27 ENCOUNTER — Ambulatory Visit
Admission: RE | Admit: 2023-09-27 | Discharge: 2023-09-27 | Disposition: A | Discharge status: Home / Self Care | Source: Ambulatory Visit | Attending: Family Medicine | Admitting: Family Medicine

## 2023-09-27 DIAGNOSIS — Z1231 Encounter for screening mammogram for malignant neoplasm of breast: Secondary | ICD-10-CM | POA: Diagnosis not present

## 2023-10-01 ENCOUNTER — Other Ambulatory Visit: Payer: Self-pay | Admitting: Family Medicine

## 2023-10-02 ENCOUNTER — Other Ambulatory Visit: Payer: Self-pay | Admitting: Family Medicine

## 2023-10-02 DIAGNOSIS — R928 Other abnormal and inconclusive findings on diagnostic imaging of breast: Secondary | ICD-10-CM

## 2023-10-17 ENCOUNTER — Ambulatory Visit
Admission: RE | Admit: 2023-10-17 | Discharge: 2023-10-17 | Disposition: A | Discharge status: Home / Self Care | Source: Ambulatory Visit | Attending: Family Medicine | Admitting: Family Medicine

## 2023-10-17 ENCOUNTER — Ambulatory Visit: Payer: Self-pay | Admitting: Family Medicine

## 2023-10-17 DIAGNOSIS — R928 Other abnormal and inconclusive findings on diagnostic imaging of breast: Secondary | ICD-10-CM

## 2023-10-17 DIAGNOSIS — N6313 Unspecified lump in the right breast, lower outer quadrant: Secondary | ICD-10-CM | POA: Diagnosis not present

## 2023-10-17 DIAGNOSIS — N6311 Unspecified lump in the right breast, upper outer quadrant: Secondary | ICD-10-CM | POA: Diagnosis not present

## 2023-10-25 DIAGNOSIS — K219 Gastro-esophageal reflux disease without esophagitis: Secondary | ICD-10-CM | POA: Diagnosis not present

## 2023-10-25 DIAGNOSIS — Z7952 Long term (current) use of systemic steroids: Secondary | ICD-10-CM | POA: Diagnosis not present

## 2023-10-25 DIAGNOSIS — R252 Cramp and spasm: Secondary | ICD-10-CM | POA: Diagnosis not present

## 2023-10-25 DIAGNOSIS — M1991 Primary osteoarthritis, unspecified site: Secondary | ICD-10-CM | POA: Diagnosis not present

## 2023-10-25 DIAGNOSIS — E669 Obesity, unspecified: Secondary | ICD-10-CM | POA: Diagnosis not present

## 2023-10-25 DIAGNOSIS — Z6833 Body mass index (BMI) 33.0-33.9, adult: Secondary | ICD-10-CM | POA: Diagnosis not present

## 2023-10-25 DIAGNOSIS — M316 Other giant cell arteritis: Secondary | ICD-10-CM | POA: Diagnosis not present

## 2023-10-25 DIAGNOSIS — Z13220 Encounter for screening for lipoid disorders: Secondary | ICD-10-CM | POA: Diagnosis not present

## 2023-10-25 LAB — LAB REPORT - SCANNED: EGFR: 66

## 2023-10-30 ENCOUNTER — Other Ambulatory Visit: Payer: Self-pay | Admitting: Family Medicine

## 2023-10-30 DIAGNOSIS — E876 Hypokalemia: Secondary | ICD-10-CM

## 2023-11-20 ENCOUNTER — Encounter: Payer: Self-pay | Admitting: Family Medicine

## 2023-11-20 ENCOUNTER — Ambulatory Visit (INDEPENDENT_AMBULATORY_CARE_PROVIDER_SITE_OTHER): Payer: PPO | Admitting: Family Medicine

## 2023-11-20 VITALS — BP 112/76 | HR 70 | Temp 97.6°F | Ht 61.0 in | Wt 174.8 lb

## 2023-11-20 DIAGNOSIS — I10 Essential (primary) hypertension: Secondary | ICD-10-CM

## 2023-11-20 DIAGNOSIS — R8290 Unspecified abnormal findings in urine: Secondary | ICD-10-CM

## 2023-11-20 LAB — POCT URINALYSIS DIPSTICK
Bilirubin, UA: NEGATIVE
Blood, UA: NEGATIVE
Glucose, UA: NEGATIVE
Ketones, UA: NEGATIVE
Leukocytes, UA: NEGATIVE
Nitrite, UA: NEGATIVE
Protein, UA: NEGATIVE
Spec Grav, UA: 1.015 (ref 1.010–1.025)
Urobilinogen, UA: 0.2 U/dL
pH, UA: 6 (ref 5.0–8.0)

## 2023-11-20 MED ORDER — CHLORTHALIDONE 25 MG PO TABS: 12.5000 mg | ORAL_TABLET | Freq: Every day | ORAL | Status: DC

## 2023-11-20 MED ORDER — PREDNISONE 1 MG PO TABS: 1.0000 mg | ORAL_TABLET | Freq: Every day | ORAL | Status: DC

## 2023-11-20 NOTE — Patient Instructions (Signed)
 Reduce Chlorthalidone  to 1/2 tablet daily  Add compression stockings to wear during the day for the leg swelling.

## 2023-11-20 NOTE — Assessment & Plan Note (Signed)
 Chronic, stable, well controlled. However pt is reporting mild dizziness at times and is worried that her BP may be too low. After discussion with patient I advised trying to reduce the chlorthalidone  to 12.5 mg daily and she will continue the telmisartan  at 20 mg daily. She will continue to check her BP at home regularly and let me know what her readings are. RTC in 6 months for her annual exam

## 2023-11-20 NOTE — Progress Notes (Signed)
 Established Patient Office Visit  Subjective   Patient ID: Sara Boyer, female    DOB: [birth year]  Age: 83 y.o. MRN: [id number]  Chief Complaint  Patient presents with   Medical Management of Chronic Issues   Dizziness    X2 months    Pt is here for follow up on multiple medical conditions.   HTN -- BP in office performed and is well controlled. She  reports no side effects to the medications, no chest pain, SOB, or headaches. She has a BP cuff at home and is checking BP regularly, reports they are in the normal range. Has been getting in the 110-120 area at home, states that she has been experiencing some lightheadedness but it is very brief and it goes away within a few seconds.  Urine odor-- pt reports she has noticed strange urine odor that has been going on for several weeks, no fever or chills, maybe some mild discomfort with urination at night mostly, is feeling some pressure in the pelvic region, maybe some tenderness, mild dysuria intermittently.  Pt states she recently had labs at [facility], will ask for records.   I also reviewed her breast US   results with her today and reassured pt about the findings. Counseled patient on monthly breast exams at home. I reviewed her medication list and updated it in the chart.     Current Outpatient Medications  Medication Instructions   ACTEMRA ACTPEN 162 MG/0.9ML SOAJ INJECT 162 MG (0.9 ML) UNDER THE SKIN ONCE A WEEK   Ascorbic Acid (VITA-C PO) 1 tablet, Daily   chlorthalidone  (HYGROTON ) 12.5 mg, Oral, Daily, with food   cholecalciferol (VITAMIN D) 1,000 Units, Daily   Multiple Vitamins-Minerals (CENTRUM SILVER PO) 1 tablet, Daily   Multiple Vitamins-Minerals (ZINC  PO) 1 tablet, Daily   OVER THE COUNTER MEDICATION 2 tablets, Daily   pantoprazole  (PROTONIX ) 40 mg, Oral, Daily   potassium chloride  (KLOR-CON  M) 10 MEQ tablet 20 mEq, Oral, 2 times daily   predniSONE  (DELTASONE ) 1 mg, Oral, Daily with breakfast    telmisartan  (MICARDIS ) 20 mg, Oral, Daily    Patient Active Problem List   Diagnosis Date Noted   CAP (community acquired pneumonia) 04/10/2023   Acute hypoxemic respiratory failure (HCC) 04/10/2023   History of temporal arteritis 04/10/2023   Hyperglycemia 04/10/2023   Temporal arteritis (HCC) 12/07/2022   Essential hypertension 12/07/2022   Bronchitis 12/07/2022   Hypokalemia 12/07/2022   Normocytic anemia 12/07/2022   Vision changes 12/06/2022   Hair loss 04/02/2018   H/O partial thyroidectomy 04/02/2018   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  Cardiovascular:  Positive for leg swelling (from chronic venous insufficiency).  Neurological:  Positive for dizziness (brief, occasional).  All other systems reviewed and are negative.     Objective:     BP 112/76   Pulse 70   Temp 97.6 F (36.4 C) (Oral)   Ht 5' 1 (1.549 m)   Wt 174 lb 12.8 oz (79.3 kg)   SpO2 99%   BMI 33.03 kg/m    Physical Exam Vitals reviewed.  Constitutional:      Appearance: Normal appearance. She is well-groomed. She is obese.   Cardiovascular:     Rate and Rhythm: Normal rate and regular rhythm.     Heart sounds: S1 normal and S2 normal.  Pulmonary:     Effort: Pulmonary effort is normal.     Breath sounds: Normal breath sounds and air entry.   Musculoskeletal:     Right lower leg: Edema (1+ on the right and left ankles) present.     Left lower leg: Edema (secondary to varicose veins BL) present.   Neurological:     Mental Status: She is alert and oriented to person, place, and time. Mental status is at baseline.     Gait: Gait is intact.   Psychiatric:        Mood and Affect: Mood and affect normal.        Speech: Speech normal.        Behavior: Behavior normal.      Results for orders placed or performed in  visit on 11/20/23  POC Urinalysis Dipstick  Result Value Ref Range   Color, UA yellow    Clarity, UA clear    Glucose, UA Negative Negative   Bilirubin, UA negative    Ketones, UA negative    Spec Grav, UA 1.015 1.010 - 1.025   Blood, UA negative    pH, UA 6.0 5.0 - 8.0   Protein, UA Negative Negative   Urobilinogen, UA 0.2 0.2 or 1.0 E.U./dL   Nitrite, UA negative    Leukocytes, UA Negative Negative   Appearance     Odor        The ASCVD Risk score (Arnett DK, et al., 2019) failed to calculate for the following reasons:   The 2019 ASCVD risk score is only valid for ages 59 to 74    Assessment & Plan:  Essential hypertension Assessment & Plan: Chronic, stable, well controlled. However pt is reporting mild dizziness at times and is worried that her BP may be too low. After discussion with patient I advised trying to reduce the chlorthalidone  to 12.5 mg daily and she will continue the telmisartan  at 20 mg daily. She will continue to check her BP at home regularly and let me know what her readings are. RTC in 6 months for her annual exam  Orders: -     Chlorthalidone ; Take 0.5 tablets (12.5 mg total) by mouth daily. with food  Abnormal urine odor UA in office reviewed with patient, reassured her that it appears to be normal. I advised using A and D ointment to help with the irritation, if not improved then I may send her to urology for evaluation.  -     POCT urinalysis dipstick  Temporal arteritis Pt down to 1 mg daily, almost finished with her steroid course. I updated her medication list and we discussed the PPI - that it could be reduced/weaned after she completes the steroids. Also pt reported a large weight gain secondary to the steroids, I advised increasing physical activity, healthy eating to help her return to her normal weight.   -     predniSONE ; Take 1 tablet (1 mg total) by mouth daily with breakfast.   I spent>30 minutes with patient today on multiple  topics.  Return in about 6 months (around 05/21/2024) for annual physical exam.    Heron CHRISTELLA Sharper, MD

## 2023-12-05 ENCOUNTER — Other Ambulatory Visit: Payer: Self-pay | Admitting: Family Medicine

## 2024-01-21 DIAGNOSIS — E669 Obesity, unspecified: Secondary | ICD-10-CM | POA: Diagnosis not present

## 2024-01-21 DIAGNOSIS — R252 Cramp and spasm: Secondary | ICD-10-CM | POA: Diagnosis not present

## 2024-01-21 DIAGNOSIS — Z7952 Long term (current) use of systemic steroids: Secondary | ICD-10-CM | POA: Diagnosis not present

## 2024-01-21 DIAGNOSIS — M316 Other giant cell arteritis: Secondary | ICD-10-CM | POA: Diagnosis not present

## 2024-01-21 DIAGNOSIS — M1991 Primary osteoarthritis, unspecified site: Secondary | ICD-10-CM | POA: Diagnosis not present

## 2024-01-21 DIAGNOSIS — Z13220 Encounter for screening for lipoid disorders: Secondary | ICD-10-CM | POA: Diagnosis not present

## 2024-01-21 DIAGNOSIS — K219 Gastro-esophageal reflux disease without esophagitis: Secondary | ICD-10-CM | POA: Diagnosis not present

## 2024-01-21 DIAGNOSIS — Z6830 Body mass index (BMI) 30.0-30.9, adult: Secondary | ICD-10-CM | POA: Diagnosis not present

## 2024-02-13 ENCOUNTER — Other Ambulatory Visit: Payer: Self-pay | Admitting: Family Medicine

## 2024-02-13 DIAGNOSIS — E876 Hypokalemia: Secondary | ICD-10-CM

## 2024-02-13 DIAGNOSIS — I10 Essential (primary) hypertension: Secondary | ICD-10-CM

## 2024-03-11 ENCOUNTER — Encounter: Payer: Self-pay | Admitting: Family Medicine

## 2024-03-11 ENCOUNTER — Ambulatory Visit (INDEPENDENT_AMBULATORY_CARE_PROVIDER_SITE_OTHER): Admitting: Family Medicine

## 2024-03-11 VITALS — BP 102/48 | HR 75 | Temp 98.1°F | Ht 61.0 in | Wt 153.6 lb

## 2024-03-11 DIAGNOSIS — J209 Acute bronchitis, unspecified: Secondary | ICD-10-CM

## 2024-03-11 DIAGNOSIS — J020 Streptococcal pharyngitis: Secondary | ICD-10-CM

## 2024-03-11 DIAGNOSIS — M316 Other giant cell arteritis: Secondary | ICD-10-CM | POA: Diagnosis not present

## 2024-03-11 LAB — POC COVID19 BINAXNOW: SARS Coronavirus 2 Ag: NEGATIVE

## 2024-03-11 LAB — POCT RAPID STREP A (OFFICE): Rapid Strep A Screen: POSITIVE — AB

## 2024-03-11 MED ORDER — METHYLPREDNISOLONE 4 MG PO TBPK: ORAL_TABLET | ORAL | 0 refills | Status: DC

## 2024-03-11 MED ORDER — AMOXICILLIN-POT CLAVULANATE 875-125 MG PO TABS: 1.0000 | ORAL_TABLET | Freq: Two times a day (BID) | ORAL | 0 refills | Status: AC

## 2024-03-11 NOTE — Patient Instructions (Signed)
 Ok to try stopping the chlorthalidone  and check BP at home daily - ok also to stop the potassium with the chlorthalidone   Continue to check blood pressure daily.

## 2024-03-11 NOTE — Progress Notes (Signed)
 Acute Office Visit  Subjective:     Patient ID: Sara Boyer, female    DOB: 05-19-1941, 83 y.o.   MRN: [id number]  Chief Complaint  Patient presents with   Cough    Productive with dark yellow sputum x4 days, tried Mucinex    Sore Throat    X4 days   Fever    Temperature of 100 degrees x1 day    Cough Associated symptoms include a fever.  Sore Throat  Associated symptoms include coughing.  Fever  Associated symptoms include coughing.   Discussed the use of AI scribe software for clinical note transcription with the patient, who gave verbal consent to proceed.  History of Present Illness   Sara BARTLE is an 83 year old female who presents with fever, sore throat, and wheezing.  She developed a fever the night before last with a severe sore throat. She experiences difficulty breathing, especially when coughing, which produces significant mucus. Chest pain occurs primarily with coughing. She is concerned about pneumonia, having had it twice before. States that she can hear the wheezing, was worried about recurrence of pneumonia that she had last year.   She notes frequent urination every two hours, which is unusual for her.   She received a flu shot at the end of September. No allergies to antibiotics. No current use of inhalers and has difficulty using them.       Review of Systems  Constitutional:  Positive for fever.  Respiratory:  Positive for cough.         Objective:    BP (!) 102/48   Pulse 75   Temp 98.1 F (36.7 C) (Oral)   Ht 5' 1 (1.549 m)   Wt 153 lb 9.6 oz (69.7 kg)   SpO2 96%   BMI 29.02 kg/m    Physical Exam Vitals reviewed.  Constitutional:      Appearance: She is well-developed.  HENT:     Right Ear: Tympanic membrane normal.     Left Ear: Tympanic membrane normal.     Mouth/Throat:     Mouth: Mucous membranes are moist.     Pharynx: Oropharyngeal exudate and posterior oropharyngeal erythema present.  Cardiovascular:      Rate and Rhythm: Normal rate and regular rhythm.     Heart sounds: Normal heart sounds.  Pulmonary:     Effort: Pulmonary effort is normal.     Breath sounds: Wheezing (diffusely, inspiratory and expiratory) present.  Neurological:     Mental Status: She is alert.     Results for orders placed or performed in visit on 03/11/24  POC COVID-19  Result Value Ref Range   SARS Coronavirus 2 Ag Negative Negative  POC Rapid Strep A  Result Value Ref Range   Rapid Strep A Screen Positive (A) Negative        Assessment & Plan:   Problem List Items Addressed This Visit       Unprioritized   Temporal arteritis (HCC)   Other Visit Diagnoses       Acute bronchitis, unspecified organism    -  Primary   Relevant Medications   amoxicillin-clavulanate (AUGMENTIN) 875-125 MG tablet   methylPREDNISolone  (MEDROL  DOSEPAK) 4 MG TBPK tablet   Other Relevant Orders   POC COVID-19 (Completed)     Strep pharyngitis       Relevant Medications   amoxicillin-clavulanate (AUGMENTIN) 875-125 MG tablet   Other Relevant Orders   POC COVID-19 (Completed)   POC Rapid Strep  A (Completed)     Assessment and Plan    Acute streptococcal pharyngitis with cough and wheezing Acute streptococcal pharyngitis with associated cough and wheezing. Recent travel history with potential exposure on a plane. Negative COVID-19 test. No evidence of pneumonia, but significant wheezing. Previous pneumonia with subsequent wheezing. Discussed possible concurrent viral infection. No chronic wheezing or asthma. Previous long-term prednisone  use for temporal arteritis with significant weight gain. Discussed risks and benefits of short-term steroid use for wheezing. - Prescribe Augmentin 875 mg twice daily for 7 days with food to prevent stomach upset. - Prescribe Medrol  Dosepak for 6 days to manage wheezing, tapering dose as per package instructions. - Monitor for persistent symptoms or worsening condition post-antibiotic  treatment. - Consider inhaler use if wheezing persists, though she has difficulty using inhalers. - Advise to report any return of fever or worsening symptoms.  Hypertension Hypertension managed with chlorthalidone  and potassium supplementation. Discussed discontinuing chlorthalidone  and monitoring blood pressure at home. If stable, discontinue potassium supplementation. Previous changes in medication due to efficacy issues. She has a blood pressure machine for home monitoring. - Discontinue chlorthalidone  and monitor blood pressure at home daily for one month. - Discontinue potassium supplementation if chlorthalidone  is stopped. - Continue telmisartan  as prescribed. - Reassess blood pressure management at follow-up in December.        Meds ordered this encounter  Medications   amoxicillin-clavulanate (AUGMENTIN) 875-125 MG tablet    Sig: Take 1 tablet by mouth 2 (two) times daily for 7 days.    Dispense:  14 tablet    Refill:  0   methylPREDNISolone  (MEDROL  DOSEPAK) 4 MG TBPK tablet    Sig: Take package as directed.    Dispense:  21 each    Refill:  0    No follow-ups on file.  Heron CHRISTELLA Sharper, MD

## 2024-05-06 NOTE — Progress Notes (Signed)
   05/06/2024  Patient ID: Hudson CHRISTELLA Dales, female   DOB: Feb 03, 1941, 83 y.o.   MRN: [id number]  Pharmacy Quality Measure Review  This patient is appearing on a report for being at risk of failing the adherence measure for hypertension (ACEi/ARB) medications this calendar year.   Medication: Telmisartan  Last fill date: 04/03/24 for 90 day supply  Insurance report was not up to date. No action needed at this time.   Jon VEAR Lindau, PharmD Clinical Pharmacist [phone]

## 2024-05-13 ENCOUNTER — Encounter: Admitting: Family Medicine

## 2024-05-20 ENCOUNTER — Encounter: Admitting: Family Medicine

## 2024-06-04 ENCOUNTER — Ambulatory Visit: Admitting: Family Medicine

## 2024-06-04 ENCOUNTER — Ambulatory Visit

## 2024-06-04 ENCOUNTER — Encounter: Payer: Self-pay | Admitting: Family Medicine

## 2024-06-04 VITALS — BP 96/60 | HR 75 | Temp 97.7°F | Ht 61.0 in | Wt 145.1 lb

## 2024-06-04 DIAGNOSIS — J209 Acute bronchitis, unspecified: Secondary | ICD-10-CM

## 2024-06-04 DIAGNOSIS — J189 Pneumonia, unspecified organism: Secondary | ICD-10-CM

## 2024-06-04 DIAGNOSIS — J101 Influenza due to other identified influenza virus with other respiratory manifestations: Secondary | ICD-10-CM

## 2024-06-04 DIAGNOSIS — Z0001 Encounter for general adult medical examination with abnormal findings: Secondary | ICD-10-CM

## 2024-06-04 DIAGNOSIS — Z13220 Encounter for screening for lipoid disorders: Secondary | ICD-10-CM | POA: Diagnosis not present

## 2024-06-04 DIAGNOSIS — I10 Essential (primary) hypertension: Secondary | ICD-10-CM | POA: Diagnosis not present

## 2024-06-04 DIAGNOSIS — E876 Hypokalemia: Secondary | ICD-10-CM

## 2024-06-04 DIAGNOSIS — Z780 Asymptomatic menopausal state: Secondary | ICD-10-CM

## 2024-06-04 DIAGNOSIS — Z131 Encounter for screening for diabetes mellitus: Secondary | ICD-10-CM | POA: Diagnosis not present

## 2024-06-04 LAB — LIPID PANEL
Cholesterol: 204 mg/dL — ABNORMAL HIGH (ref 28–200)
HDL: 56.9 mg/dL
LDL Cholesterol: 112 mg/dL — ABNORMAL HIGH (ref 10–99)
NonHDL: 146.64
Total CHOL/HDL Ratio: 4
Triglycerides: 173 mg/dL — ABNORMAL HIGH (ref 10.0–149.0)
VLDL: 34.6 mg/dL (ref 0.0–40.0)

## 2024-06-04 LAB — COMPREHENSIVE METABOLIC PANEL WITH GFR
ALT: 30 U/L (ref 3–35)
AST: 35 U/L (ref 5–37)
Albumin: 4.3 g/dL (ref 3.5–5.2)
Alkaline Phosphatase: 49 U/L (ref 39–117)
BUN: 23 mg/dL (ref 6–23)
CO2: 30 meq/L (ref 19–32)
Calcium: 9.1 mg/dL (ref 8.4–10.5)
Chloride: 100 meq/L (ref 96–112)
Creatinine, Ser: 1.02 mg/dL (ref 0.40–1.20)
GFR: 50.3 mL/min — ABNORMAL LOW
Glucose, Bld: 100 mg/dL — ABNORMAL HIGH (ref 70–99)
Potassium: 3.2 meq/L — ABNORMAL LOW (ref 3.5–5.1)
Sodium: 139 meq/L (ref 135–145)
Total Bilirubin: 0.9 mg/dL (ref 0.2–1.2)
Total Protein: 6.4 g/dL (ref 6.0–8.3)

## 2024-06-04 LAB — HEMOGLOBIN A1C: Hgb A1c MFr Bld: 5 % (ref 4.6–6.5)

## 2024-06-04 LAB — POCT INFLUENZA A/B
Influenza A, POC: POSITIVE — AB
Influenza B, POC: NEGATIVE

## 2024-06-04 LAB — POC COVID19 BINAXNOW: SARS Coronavirus 2 Ag: NEGATIVE

## 2024-06-04 MED ORDER — CHLORTHALIDONE 25 MG PO TABS: 25.0000 mg | ORAL_TABLET | Freq: Every day | ORAL | 1 refills | Status: AC

## 2024-06-04 MED ORDER — POTASSIUM CHLORIDE CRYS ER 10 MEQ PO TBCR: 20.0000 meq | EXTENDED_RELEASE_TABLET | Freq: Two times a day (BID) | ORAL | 1 refills | Status: AC

## 2024-06-04 MED ORDER — METHYLPREDNISOLONE 4 MG PO TBPK: ORAL_TABLET | ORAL | 0 refills | Status: AC

## 2024-06-04 MED ORDER — TELMISARTAN 20 MG PO TABS: 20.0000 mg | ORAL_TABLET | Freq: Every day | ORAL | 1 refills | Status: AC

## 2024-06-04 MED ORDER — ALBUTEROL SULFATE HFA 108 (90 BASE) MCG/ACT IN AERS: 2.0000 | INHALATION_SPRAY | Freq: Four times a day (QID) | RESPIRATORY_TRACT | 2 refills | Status: AC | PRN

## 2024-06-04 MED ORDER — AZITHROMYCIN 250 MG PO TABS: ORAL_TABLET | ORAL | 0 refills | Status: AC

## 2024-06-04 MED ORDER — AMOXICILLIN-POT CLAVULANATE 875-125 MG PO TABS: 1.0000 | ORAL_TABLET | Freq: Two times a day (BID) | ORAL | 0 refills | Status: AC

## 2024-06-04 NOTE — Progress Notes (Signed)
 "  Complete physical exam  Patient: Sara Boyer   DOB: 1940-12-31   84 y.o. Female  MRN: [id number]  Subjective:    Chief Complaint  Patient presents with   Medicare Wellness    Sara Boyer is a 84 y.o. female who presents today for a complete physical exam. She reports consuming a general diet. Home exercise routine includes patient stays active, no formal activity but does all her own housework, cooking, gardening, etc. She generally feels well. She reports sleeping well. She does have additional problems to discuss today.   Pt is also complaining of URI symptoms-- states that this is the third time she has been sick since October, she has been having coughing, subjective fever, and also reports decreased appetite and diarrhea, states she recently returned from a cruise about 10 days ago, didn't start having symptoms until about 5 days ago. Reports she doesn't seem to be getting better, feels very weak as well.   Most recent fall risk assessment:    06/04/2024   10:15 AM  Fall Risk   Falls in the past year? 1  Number falls in past yr: 1  Injury with Fall? 0  Risk for fall due to : Impaired balance/gait;Impaired mobility  Follow up Falls evaluation completed     Most recent depression screenings:    06/04/2024   10:19 AM 09/06/2023   10:23 AM  PHQ 2/9 Scores  PHQ - 2 Score 0 0    Vision:used to see Dr. 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  HENT:  Negative for ear discharge, ear pain and hearing loss.   Eyes:  Negative for double vision.  Respiratory:  Positive for cough, sputum production and shortness of breath.   Cardiovascular:  Negative for chest pain.  Gastrointestinal:  Positive for diarrhea.  Musculoskeletal:  Positive for myalgias.  Neurological:  Negative for dizziness and headaches.  All other systems reviewed and are negative.      Objective:     BP 96/60   Pulse 75   Temp 97.7 F (36.5 C) (Oral)   Ht 5' 1 (1.549 m)   Wt 145 lb 1.6 oz (65.8 kg)   SpO2 95%   BMI 27.42 kg/m    Physical Exam Vitals reviewed.  Constitutional:      Appearance: Normal appearance. She is well-groomed and normal weight.  Eyes:     Conjunctiva/sclera: Conjunctivae normal.  Neck:     Thyroid : No thyromegaly.  Cardiovascular:     Rate and Rhythm: Normal rate and regular rhythm.     Pulses: Normal pulses.     Heart sounds:  S1 normal and S2 normal.  Pulmonary:     Effort: Pulmonary effort is normal.     Breath sounds: Normal breath sounds and air entry.  Abdominal:     General: Bowel sounds are normal.  Musculoskeletal:     Right lower leg: No edema.     Left lower leg: No edema.  Neurological:     Mental Status: She is alert and oriented to person, place, and time. Mental status is at baseline.     Gait: Gait is intact.  Psychiatric:        Mood and Affect: Mood and affect normal.        Speech: Speech normal.        Behavior: Behavior normal.        Judgment: Judgment normal.      Results for orders placed or performed in visit on  06/04/24  POC Influenza A/B  Result Value Ref Range   Influenza A, POC Positive (A) Negative   Influenza B, POC Negative Negative  POC COVID-19 BinaxNow  Result Value Ref Range   SARS Coronavirus 2 Ag Negative Negative       Assessment & Plan:    Routine Health Maintenance and Physical Exam  Immunization History  Administered Date(s) Administered   Hepatitis A, Ped/Adol-2 Dose 10/09/2002   INFLUENZA, HIGH DOSE SEASONAL PF 03/06/2014, 03/05/2015   Influenza Split 05/20/2009, 04/15/2010, 02/27/2011, 03/27/2012, 02/26/2013   Influenza-Unspecified 03/24/2017, 02/27/2023, 01/28/2024   Pneumococcal Conjugate-13 08/25/2013   Pneumococcal Polysaccharide-23 02/27/2004   Zoster Recombinant(Shingrix) 12/21/2017, 03/11/2018   Zoster, Live 05/10/2006    Health Maintenance  Topic Date Due   Pneumococcal Vaccine: 50+ Years (3 of 3 - PCV20 or PCV21) 08/26/2018   COVID-19 Vaccine (1 - 2025-26 season) Never done   Medicare Annual Wellness (AWV)  09/05/2024   Influenza Vaccine  Completed   Bone Density Scan  Completed   Meningococcal B Vaccine  Aged Out   DTaP/Tdap/Td  Discontinued   Zoster Vaccines- Shingrix  Discontinued    Discussed health benefits of physical activity, and encouraged her to engage in regular exercise appropriate for her age and condition.  Encounter for general adult medical examination with abnormal findings General physical exam findings are abnormal today. She is wheezing diffusely on exam and is positive for Flu A in the office.  I reviewed the patient's preventative testing, immunizations, and lifestyle habits. I made appropriate recommendations and placed orders for the appropriate tests and/or vaccinations. I counseled the patient on the CDC's recommendations for healthy exercise and diet. I counseled the patient on healthy sleep habits and stress management. Handouts to reinforce the counseling were given at the conclusion of the visit.    Hypokalemia -      Potassium Chloride  Crys ER; Take 2 tablets (20 mEq total) by mouth 2 (two) times daily.  Dispense: 120 tablet; Refill: 1  Essential hypertension Chronic, stable, BP controlled, will continue the below medications  -     Telmisartan ; Take 1 tablet (20 mg total) by mouth daily.  Dispense: 90 tablet; Refill: 1 -     Chlorthalidone ; Take 1 tablet (25 mg total) by mouth daily. with food  Dispense: 90 tablet; Refill: 1 -     Comprehensive metabolic panel with GFR; Future  Influenza A Patient is 5 days out from the start of her illness, will treat symptomatically, I am concerned about the risk/ possibility of pneumonia given her history of chronic bronchitis, frequent exacerbations and history of being hospitalized with  pnuemonia 2 years ago. Will go ahead and order CXR and given empiric antibiotics for this high risk patient.   -     POCT Influenza A/B -     POC COVID-19 BinaxNow  Postmenopausal state -     DG Bone Density; Future  Diabetes mellitus screening -     Hemoglobin A1c; Future  Lipid screening -     Lipid panel; Future  Acute bronchitis, unspecified organism -     methylPREDNISolone ; Take package as directed.  Dispense: 21 each; Refill: 0 -     Albuterol  Sulfate HFA; Inhale 2 puffs into the lungs every 6 (six) hours as needed for wheezing or shortness of breath.  Dispense: 8 g; Refill: 2  Pneumonia of both lower lobes due to infectious organism -     methylPREDNISolone ; Take package as directed.  Dispense: 21 each; Refill: 0 -     DG Chest 2 View; Future -     Amoxicillin -Pot Clavulanate; Take 1 tablet by mouth 2 (two) times daily for 7 days.  Dispense: 14 tablet; Refill: 0 -     Azithromycin ; Take 2 tablets on day 1, then 1 tablet daily on days 2 through 5  Dispense: 6 tablet; Refill: 0    Return in about 6 months (around 12/02/2024) for HTN.     Heron CHRISTELLA Sharper, MD     [1]  Outpatient Medications Prior to Visit  Medication Sig   Ascorbic Acid (VITA-C PO) Take 1  tablet by mouth daily.   cholecalciferol (VITAMIN D) 1000 UNITS tablet Take 1,000 Units by mouth daily.   Multiple Vitamins-Minerals (CENTRUM SILVER PO) Take 1 tablet by mouth daily.   Multiple Vitamins-Minerals (ZINC  PO) Take 1 tablet by mouth daily.   OVER THE COUNTER MEDICATION Take 2 tablets by mouth daily. Hair skin and nails-daily   [DISCONTINUED] chlorthalidone  (HYGROTON ) 25 MG tablet TAKE 1 TABLET BY MOUTH ONCE DAILY WITH FOOD   [DISCONTINUED] methylPREDNISolone  (MEDROL  DOSEPAK) 4 MG TBPK tablet Take package as directed.   [DISCONTINUED] potassium chloride  (KLOR-CON  M) 10 MEQ tablet TAKE 2 TABLETS BY MOUTH TWICE DAILY   [DISCONTINUED] telmisartan  (MICARDIS ) 20 MG tablet TAKE 1 TABLET (20 MG TOTAL) BY MOUTH DAILY.   [DISCONTINUED] ACTEMRA ACTPEN 162 MG/0.9ML SOAJ INJECT 162 MG (0.9 ML) UNDER THE SKIN ONCE A WEEK   No facility-administered medications prior to visit.   "

## 2024-06-09 ENCOUNTER — Ambulatory Visit: Payer: Self-pay | Admitting: Family Medicine

## 2024-06-09 NOTE — Progress Notes (Signed)
 That might have been because of her acute illness that she had when I saw her-- if she is feeling better she should start checking her blood pressure and if it is elevated she needs to restart the chlorthalidone  and the potassium

## 2024-06-16 ENCOUNTER — Ambulatory Visit
Admission: RE | Admit: 2024-06-16 | Discharge: 2024-06-16 | Disposition: A | Source: Ambulatory Visit | Attending: Family Medicine

## 2024-06-16 DIAGNOSIS — Z780 Asymptomatic menopausal state: Secondary | ICD-10-CM

## 2024-12-02 ENCOUNTER — Ambulatory Visit: Admitting: Family Medicine
